# Patient Record
Sex: Female | Born: 1990 | Race: White | Hispanic: No | Marital: Married | Smoking: Never smoker
Health system: Southern US, Community
[De-identification: ages and names within clinical notes are randomized; demographics above are authoritative.]

## PROBLEM LIST (undated history)

## (undated) DIAGNOSIS — I7300 Raynaud's syndrome without gangrene: Secondary | ICD-10-CM

## (undated) DIAGNOSIS — F988 Other specified behavioral and emotional disorders with onset usually occurring in childhood and adolescence: Secondary | ICD-10-CM

## (undated) DIAGNOSIS — J329 Chronic sinusitis, unspecified: Secondary | ICD-10-CM

## (undated) DIAGNOSIS — K589 Irritable bowel syndrome without diarrhea: Secondary | ICD-10-CM

## (undated) DIAGNOSIS — J302 Other seasonal allergic rhinitis: Secondary | ICD-10-CM

## (undated) DIAGNOSIS — K219 Gastro-esophageal reflux disease without esophagitis: Secondary | ICD-10-CM

## (undated) DIAGNOSIS — R350 Frequency of micturition: Secondary | ICD-10-CM

## (undated) DIAGNOSIS — I10 Essential (primary) hypertension: Secondary | ICD-10-CM

## (undated) DIAGNOSIS — T8859XA Other complications of anesthesia, initial encounter: Secondary | ICD-10-CM

## (undated) DIAGNOSIS — T783XXA Angioneurotic edema, initial encounter: Secondary | ICD-10-CM

## (undated) DIAGNOSIS — E611 Iron deficiency: Secondary | ICD-10-CM

## (undated) DIAGNOSIS — O139 Gestational [pregnancy-induced] hypertension without significant proteinuria, unspecified trimester: Secondary | ICD-10-CM

## (undated) DIAGNOSIS — Z87440 Personal history of urinary (tract) infections: Secondary | ICD-10-CM

## (undated) HISTORY — DX: Other complications of anesthesia, initial encounter: T88.59XA

## (undated) HISTORY — DX: Angioneurotic edema, initial encounter: T78.3XXA

## (undated) HISTORY — PX: DRUG INDUCED ENDOSCOPY: SHX6808

## (undated) HISTORY — DX: Iron deficiency: E61.1

## (undated) HISTORY — DX: Other specified behavioral and emotional disorders with onset usually occurring in childhood and adolescence: F98.8

## (undated) HISTORY — DX: Raynaud's syndrome without gangrene: I73.00

## (undated) HISTORY — PX: WISDOM TOOTH EXTRACTION: SHX21

## (undated) HISTORY — DX: Chronic sinusitis, unspecified: J32.9

## (undated) HISTORY — DX: Gestational (pregnancy-induced) hypertension without significant proteinuria, unspecified trimester: O13.9

## (undated) HISTORY — PX: CYSTOSCOPY: SUR368

## (undated) HISTORY — DX: Gastro-esophageal reflux disease without esophagitis: K21.9

## (undated) HISTORY — DX: Personal history of urinary (tract) infections: Z87.440

## (undated) HISTORY — DX: Irritable bowel syndrome, unspecified: K58.9

## (undated) HISTORY — DX: Essential (primary) hypertension: I10

## (undated) HISTORY — DX: Other seasonal allergic rhinitis: J30.2

## (undated) HISTORY — PX: COLONOSCOPY: SHX174

## (undated) HISTORY — DX: Frequency of micturition: R35.0

---
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
## 2011-01-14 ENCOUNTER — Encounter: Payer: Self-pay | Admitting: Gastroenterology

## 2011-02-04 ENCOUNTER — Ambulatory Visit: Payer: 59 | Admitting: Gastroenterology

## 2011-05-15 DIAGNOSIS — R350 Frequency of micturition: Secondary | ICD-10-CM

## 2011-05-15 HISTORY — DX: Frequency of micturition: R35.0

## 2011-12-17 ENCOUNTER — Encounter: Payer: Self-pay | Admitting: Internal Medicine

## 2011-12-17 ENCOUNTER — Ambulatory Visit (INDEPENDENT_AMBULATORY_CARE_PROVIDER_SITE_OTHER): Payer: 59 | Admitting: Internal Medicine

## 2011-12-17 VITALS — BP 124/89 | HR 88 | Temp 98.4°F | Ht 67.0 in | Wt 159.2 lb

## 2011-12-17 DIAGNOSIS — F988 Other specified behavioral and emotional disorders with onset usually occurring in childhood and adolescence: Secondary | ICD-10-CM | POA: Insufficient documentation

## 2011-12-17 DIAGNOSIS — R350 Frequency of micturition: Secondary | ICD-10-CM

## 2011-12-17 DIAGNOSIS — J069 Acute upper respiratory infection, unspecified: Secondary | ICD-10-CM | POA: Insufficient documentation

## 2011-12-17 DIAGNOSIS — F514 Sleep terrors [night terrors]: Secondary | ICD-10-CM | POA: Insufficient documentation

## 2011-12-17 DIAGNOSIS — J302 Other seasonal allergic rhinitis: Secondary | ICD-10-CM | POA: Insufficient documentation

## 2011-12-17 DIAGNOSIS — G478 Other sleep disorders: Secondary | ICD-10-CM

## 2011-12-17 NOTE — Progress Notes (Signed)
Patient ID: Kimberly Larson, female   DOB: 1990-11-07, 21 y.o.   MRN: [id number]    [facility] for Infectious Disease  Reason for Consult: Recurrent upper respiratory infections and possible persistent urinary tract infections Referring Physician: Dr. Silvestre Moment  Patient Active Problem List  Diagnosis  . ADD (attention deficit disorder)  . Seasonal allergies  . Recurrent URI (upper respiratory infection)  . Urinary frequency  . Night terrors    Patient's Medications  New Prescriptions   No medications on file  Previous Medications   AMPHETAMINE-DEXTROAMPHETAMINE (ADDERALL) 30 MG TABLET    Take 30 mg by mouth daily.   CEFDINIR (OMNICEF) 300 MG CAPSULE    Take 300 mg by mouth 2 (two) times daily.   IBUPROFEN (ADVIL,MOTRIN) 200 MG TABLET    Take 600 mg by mouth as needed.   NORGESTIMATE-ETHINYL ESTRADIOL TRIPHASIC (TRI-SPRINTEC) 0.18/0.215/0.25 MG-35 MCG TABLET    Take 1 tablet by mouth daily.   TRAMADOL (ULTRAM) 50 MG TABLET    Take 50 mg by mouth every 6 (six) hours as needed.  Modified Medications   No medications on file  Discontinued Medications   CEFDINIR (OMNICEF) 300 MG CAPSULE    Take 300 mg by mouth every 12 (twelve) hours.    Recommendations: 1. Discontinue cefdinir and observe off of antibiotics  2. I asked her to call me if she has further questions or concerns about recurrent infection  Assessment: I doubt that her persistent urinary frequency is related to urinary tract infections. She has had some evidence of pyuria on occasion but no positive urine cultures that I am aware of. She does not have dysuria or other symptoms of infection and does not respond well to antibiotic therapy. I do not favor episodic or chronic suppressive antibiotic therapy for her urinary frequency. I do not know the cause of her urinary frequency but I do not think that there is any significant benefit to be gained from further antibiotic therapy and would not want to risk increasing  risk of adverse reactions such as C. difficile colitis or emerging multidrug resistant bacteria.  She probably has had recurrent upper respiratory tract infections over the last several months. 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 Group C strep can cause symptomatic pharyngitis but it does not need to be managed like group A strep. I suspect that most of her recent upper respiratory tract infections have been viral in origin. In order to try to limit unnecessary antibiotic exposure I suggested that she try symptomatic therapy with nonsteroidal anti-inflammatory drugs and decongestant nasal sprays for future episodes. I would try to reserve antibiotic therapy for proven episodes of group A strep pharyngitis or if she had persistent symptoms with fever greater than 101 or evidence of pneumonia. I do not think that she has any significant immunodeficiency.   I do not think that there is any direct correlation between her nightmares and her other recent problems. She does not see any direct correlation between starting Adderall in early 2012 and worsening of her sleep pattern. However, I suggested that she discuss this with Dr. Lenise Arena. She reiterates that her exam that she feels that she's under a great deal of stress but denies feeling depressed. She denies any problems with alcohol or drug use. Her father states that he thinks that she might benefit from lifestyle counseling. However, she states that she feels this would only  give her something else to fit into an already packed schedule and that it might only increase her stress. She is attempting to get regular exercise.  HPI: Kimberly Larson is a 21 y.o. 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 She was well until June of last year when she had an episode of severe right lower quadrant abdominal pain. She was seen in the emergency department at [facility]. She had no dysuria at the time but today urinalyses showed 11-20 white blood cells and she was then treated empirically for possible urinary tract infection. She states the pain resolved fairly promptly but several months later she started noticing urinary frequency. She was having to go to the bathroom to urinate about 10 times daily. She had nocturia x1. The urinary frequency has been persistent ever since that time and has never completely resolved. She recalls having many urine cultures done and believes most of them have been negative. She had been treated with multiple rounds of antibiotics. She states that she may notice a little bit of improvement with the frequency but the antibiotics did not make it go away. She was on a long course of suppressive amoxicillin without any improvement. Her gynecologist, Dr. Duane Lope, referred her to see Dr. Bjorn Pippin, for urological evaluation. He performed a CT scan which showed some bilateral scarring suggesting possible ureteral reflux. She later saw Dr. Eudelia Bunch at [facility] and underwent cystoscopy urethrogram and cystoscopy 5 days ago. She told that the studies were completely normal he was put on Macrobid for chronic suppressive therapy.  She has also had recent problems with recurrent sinus congestion. She estimates that this has happened 4-5 times since January. She will notice sinus congestion with purulent nasal secretions, sore throat and pressure over her sinuses. She will occasionally have low-grade fever to a maximum of 100.1. She has been seen by several different providers hearing [place] and then balloon. She has had multiple throat swabs for strep. One culture grew group C strep. She has had multiple courses of antibiotics for presumed upper respiratory infections. She was seen recently her primary care office. A rapid strep screen was negative but she was put on Cefdinir for the possibility that the  group C strep was causing symptomatic infection. Macrobid was stopped at that time.  She also notes that she has had problems with nightmares and even night terrors for several years. She saw psychologist several years ago. She feels like the nightmares have worsened recently and wanted to know if that might have some relationship with her recurrent infections. When she is in school she often does not go to bed until midnight and may not fall asleep until 1:00. She will usually be up out of bed by 7:00 for class. She is getting more sleep during summer break but does not feel like she is sleeping well and does not feel rested during the day.  Review of Systems: Pertinent items are noted in HPI.      Past Medical History  Diagnosis Date  . ADD (attention deficit disorder)   . Seasonal allergies   . Chronic sinus infection   . Urinary frequency 05/15/11  . Personal history of urinary (tract) infection     History  Substance Use Topics  . Smoking status: Never Smoker   . Smokeless tobacco: Not on file  . Alcohol Use: No    No family history on file. Allergies  Allergen Reactions  .  Ciprofloxacin Hcl Nausea Only    OBJECTIVE: Blood pressure 124/89, pulse 88, temperature 98.4 F (36.9 C), temperature source Oral, height 5\' 7"  (1.702 m), weight 159 lb 4 oz (72.235 kg). General: She is a healthy-appearing young woman in no distress Skin: No rash Lymph nodes: No palpable adenopathy Oral: Teeth in good condition. No oropharyngeal lesions Ears: Tympanic membranes normal bilaterally Lungs: Clear Cor: Regular S1 and S2 with no murmurs Abdomen: Soft and nontender  Microbiology: No results found for this or any previous visit (from the past 240 hour(s)).  Cliffton Asters, MD [facility] for Infectious Disease [facility] [phone] pager   [phone] cell 12/17/2011, 5:01 PM

## 2014-10-02 ENCOUNTER — Ambulatory Visit (INDEPENDENT_AMBULATORY_CARE_PROVIDER_SITE_OTHER): Payer: 59 | Admitting: Licensed Clinical Social Worker

## 2014-10-02 DIAGNOSIS — F331 Major depressive disorder, recurrent, moderate: Secondary | ICD-10-CM

## 2014-10-23 ENCOUNTER — Ambulatory Visit: Payer: 59 | Admitting: Licensed Clinical Social Worker

## 2015-10-02 ENCOUNTER — Ambulatory Visit (INDEPENDENT_AMBULATORY_CARE_PROVIDER_SITE_OTHER): Payer: Managed Care, Other (non HMO) | Admitting: Psychiatry

## 2015-10-02 DIAGNOSIS — F901 Attention-deficit hyperactivity disorder, predominantly hyperactive type: Secondary | ICD-10-CM

## 2017-06-29 ENCOUNTER — Encounter: Payer: Self-pay | Admitting: Gastroenterology

## 2017-07-23 DIAGNOSIS — F411 Generalized anxiety disorder: Secondary | ICD-10-CM | POA: Diagnosis not present

## 2017-07-28 DIAGNOSIS — D122 Benign neoplasm of ascending colon: Secondary | ICD-10-CM | POA: Diagnosis not present

## 2017-07-28 DIAGNOSIS — D125 Benign neoplasm of sigmoid colon: Secondary | ICD-10-CM | POA: Diagnosis not present

## 2017-07-28 DIAGNOSIS — K6389 Other specified diseases of intestine: Secondary | ICD-10-CM | POA: Diagnosis not present

## 2017-07-28 DIAGNOSIS — K921 Melena: Secondary | ICD-10-CM | POA: Diagnosis not present

## 2017-07-28 DIAGNOSIS — R1084 Generalized abdominal pain: Secondary | ICD-10-CM | POA: Diagnosis not present

## 2017-07-28 DIAGNOSIS — R194 Change in bowel habit: Secondary | ICD-10-CM | POA: Diagnosis not present

## 2017-07-28 DIAGNOSIS — K635 Polyp of colon: Secondary | ICD-10-CM | POA: Diagnosis not present

## 2017-07-29 ENCOUNTER — Encounter: Payer: Self-pay | Admitting: Gastroenterology

## 2017-07-29 DIAGNOSIS — R197 Diarrhea, unspecified: Secondary | ICD-10-CM | POA: Diagnosis not present

## 2017-07-29 DIAGNOSIS — D125 Benign neoplasm of sigmoid colon: Secondary | ICD-10-CM | POA: Diagnosis not present

## 2017-08-06 DIAGNOSIS — F411 Generalized anxiety disorder: Secondary | ICD-10-CM | POA: Diagnosis not present

## 2017-08-20 DIAGNOSIS — Z01419 Encounter for gynecological examination (general) (routine) without abnormal findings: Secondary | ICD-10-CM | POA: Diagnosis not present

## 2017-08-20 DIAGNOSIS — Z124 Encounter for screening for malignant neoplasm of cervix: Secondary | ICD-10-CM | POA: Diagnosis not present

## 2017-08-27 DIAGNOSIS — F411 Generalized anxiety disorder: Secondary | ICD-10-CM | POA: Diagnosis not present

## 2017-08-31 DIAGNOSIS — R350 Frequency of micturition: Secondary | ICD-10-CM | POA: Diagnosis not present

## 2017-08-31 DIAGNOSIS — R030 Elevated blood-pressure reading, without diagnosis of hypertension: Secondary | ICD-10-CM | POA: Diagnosis not present

## 2017-08-31 DIAGNOSIS — R1084 Generalized abdominal pain: Secondary | ICD-10-CM | POA: Diagnosis not present

## 2018-12-17 ENCOUNTER — Telehealth: Payer: Self-pay | Admitting: *Deleted

## 2018-12-17 NOTE — Telephone Encounter (Signed)
Received an email from [employer] that had been passed down the line to her from the patient's father that she was in need of a Cardiology consult.  I called and spoke with the patient who states that her apple watch and notified her of a low HR of 36 while she was sleeping.  She has been on Bisoprolol for HTN and is currently weaning off.  Last dose is scheduled for Sat.  Patient states that she works out 3-4 times a week and is down 30 pounds from her heaviest weight.  Last week after her workout she had chest pain that she describes as "feeling like her begal dog was sitting on her chest."  This resolved on it's on and has not occurred since.  She did not work out the next day but has since resumed and had no problems.  She is followed by Dr. Donnetta Hail [facility]  as her PCP who has advised a monitor.  On 12/16/18 she was seen by her GYN and her BP was 149/90.  She also has anemia and IBS and is needing an upper endoscopy but her GI doctor in [place] will not do this until she has been worked up by cardiology.  I have scheduled her with Dr. Saunders Revel for 12/22/18 at 8:40am.  She is aware to wear a mask and not arrive more than 15 mins prior to the appointment.  I gave her the address of the office and number.  She was appreciative of my help.

## 2018-12-20 NOTE — Progress Notes (Signed)
New Outpatient Visit Date: 12/22/2018  Referring Provider: Danie Larson, Kimberly M, PA-C [address] [place] [id number]  Chief Complaint: Low heart rate, chest pain, and headaches  HPI:  Kimberly Larson is a 28 y.o. female who is being seen today for the evaluation of chest pain and low heart rate at the request of Kimberly Larson, Kimberly M, PA-C. She has a history of ADD, recurrent URI's and strep infections, IBS, and seasonal allergies.  Today, Kimberly Larson has multiple concerns.  She began having headaches last year, which prompted her to take her blood pressure.  She noticed this was typically elevated, up to 150/105.  She was started on bisoprolol-HCTZ by her PCP.  A few weeks ago, she began experiencing recurrent headaches, which have continued.  She describes her headaches as feeling as if her head were being "juiced.".  She takes acetaminophen and ibuprofen with some improvement.  Around the time this began last month, Kimberly Larson's Apple Watch began notifying her of low heart rates overnight, with readings as low as 36 bpm.  She reports some bradycardia in the past, though never this low.  There were not any medication changes around that time.  She was seen by her PCP on 12/03/2018, at which time it was agreed upon to discontinue bisoprolol-HCTZ, oral contraceptive therapy, and Strattera.  She continues to use Adderall, though not on a daily basis.  Last month, Kimberly Larson also had an episode of chest pain that occurred while she was running.  She describes it as a stabbing sensation in the center of her chest that was followed by nausea and vomiting.  She looked in the mirror and noticed blue discoloration along the base of her neck and extending down to her chest.  Sharp pain gradually resolved with rest, though she continued to have heaviness across her chest into the next day.  That has since resolved on its own.  She still feels like her sternum is rotated at times, with increased discomfort when  lying on her left side in bed.  She is currently undergoing GI work-up with plans for EGD once more recent cardiac issues have been evaluated.  Kimberly Larson denies a history of cardiac disease.  She has not experienced palpitations, lightheadedness, orthopnea, PND, or edema.  She is typically active and exercises regularly, not having any symptoms until the aforementioned episode of chest pain.  She has not been exercising regularly since then.  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  She was recently started on iron supplementation after being found to be iron deficient.  However, she has not felt any different since starting this.  --------------------------------------------------------------------------------------------------  Cardiovascular History & Procedures: Cardiovascular Problems:  Atypical chest pain  Hypertension  Risk Factors:  Hypertension  Cath/PCI:  None  CV Surgery:  None  EP Procedures and Devices:  None  Non-Invasive Evaluation(s):  None  --------------------------------------------------------------------------------------------------  Past Medical History:  Diagnosis Date  . ADD (attention deficit disorder)   . Chronic sinus infection   . Iron deficiency   . Irritable bowel syndrome   . Personal history of urinary (tract) infection   . Raynaud's disease   . Seasonal allergies   . Urinary frequency 05/15/11    History reviewed. No pertinent surgical history.  Current Meds  Medication Sig  . amphetamine-dextroamphetamine (ADDERALL) 10 MG tablet Take 10 mg by mouth daily with breakfast.  . ferrous sulfate 324 MG TBEC Take 324 mg by mouth daily.  .Marland Kitchen  valACYclovir (VALTREX) 500 MG tablet Take by mouth as needed.    Allergies: Ciprofloxacin hcl  Social History   Tobacco Use  . Smoking status: Never Smoker  . Smokeless tobacco: Never Used  Substance Use Topics  . Alcohol use: Not on file  . Drug use: No    Family History  Problem  Relation Age of Onset  . Hypertension Mother   . Graves' disease Mother   . Other Father        trigeminal neuralgia  . Hypertension Brother     Review of Systems: A 12-system review of systems was performed and was negative except as noted in the HPI.  --------------------------------------------------------------------------------------------------  Physical Exam: BP (!) 120/100 (BP Location: Right Arm, Patient Position: Sitting, Cuff Size: Normal)   Pulse (!) 48   Ht 5\' 7"  (1.702 Larson)   Wt 157 lb 4 oz (71.3 kg)   BMI 24.63 kg/Larson   Repeat BP: BP 127/90  General: NAD. HEENT: No conjunctival pallor or scleral icterus.  Facemask in place Neck: Supple without lymphadenopathy, thyromegaly, JVD, or HJR. No carotid bruit. Lungs: Normal work of breathing. Clear to auscultation bilaterally without wheezes or crackles. Heart: Bradycardic but regular without murmurs, rubs, or gallops. Non-displaced PMI. Abd: Bowel sounds present. Soft, NT/ND without hepatosplenomegaly Ext: No lower extremity edema. Radial, PT, and DP pulses are 2+ bilaterally Skin: Warm and dry without rash. Neuro: CNIII-XII intact. Strength and fine-touch sensation intact in upper and lower extremities bilaterally. Psych: Normal mood and affect.  EKG: Bradycardia (heart rate 48 bpm).  Otherwise, no abnormality.  Outside labs (12/03/2018): CMP: Sodium 137, potassium 4.2, chloride 101, CO2 27, BUN 15, creatinine 1.0, glucose 80, calcium 9.6, AST 15, ALT 5, alkaline phosphatase 34, total bilirubin 0.4, total protein 6.7, albumin 4.5  TSH: 1.57  Ferritin: 21.5, iron saturation 12%  ANA: Negative  --------------------------------------------------------------------------------------------------  ASSESSMENT AND PLAN: Sinus bradycardia: Heart rate remains in the upper 40s at rest, albeit without symptoms.  Given young age and regular exercise, this is not excessively slow.  Recent bradycardia noted at night may have been  exacerbated by ongoing beta-blocker use (this has now been discontinued).  Recent thyroid studies were unrevealing.  Given other symptoms including chest pain and hypertension, we have agreed to obtain a 14-day event monitor for further assessment.  If significant nocturnal bradycardia persists, assessment for sleep apnea may need to be considered in the future.  Chest pain: Symptoms have both typical and atypical qualities.  While it did begin with exercise, it lasted for more than a day and was accompanied by bluish discoloration of the chest wall.  EKG today does not show any ischemic changes.  Patient is also quite young for ischemic heart disease.  Given early onset hypertension, secondary causes including coarctation of the aorta, must be considered.  We will begin with a transthoracic echocardiogram.  Based on result, cross-sectional imaging (CTA versus MRA) may need to be considered down the road.  I have asked [name] to refrain from strenuous exercise pending further evaluation.  Hypertension: Blood pressure today is borderline elevated on recheck, notably with high diastolic pressure.  It is unusual for a woman of the patient's age to present with essential hypertension, though she notes a strong family history.  I think it would be reasonable to begin with an echocardiogram to exclude structural cardiac abnormalities and screen for findings suggestive of coarctation, as well as a renal artery Doppler to exclude renal artery stenosis, as the patient's age  and gender place her at increased risk for fibromuscular dysplasia.  If this work-up is unrevealing, further testing for hyperaldosteronism and pheochromocytoma will need to be pursued.  I will defer restarting antihypertensive therapy at this time; I would recommend avoiding beta-blockers in the future given low resting heart rate.  I think it is reasonable to discontinue Strattera as well as consider discontinuation of Adderall, if possible,  as the stimulants can worsen hypertension.  Follow-up: Return to clinic in 6 weeks.  Yvonne Kendallhristopher Marely Apgar, MD 12/22/2018 12:54 PM

## 2018-12-22 ENCOUNTER — Other Ambulatory Visit: Payer: Self-pay

## 2018-12-22 ENCOUNTER — Ambulatory Visit (INDEPENDENT_AMBULATORY_CARE_PROVIDER_SITE_OTHER): Payer: BC Managed Care – PPO

## 2018-12-22 ENCOUNTER — Ambulatory Visit (INDEPENDENT_AMBULATORY_CARE_PROVIDER_SITE_OTHER): Payer: BC Managed Care – PPO | Admitting: Internal Medicine

## 2018-12-22 ENCOUNTER — Encounter: Payer: Self-pay | Admitting: Internal Medicine

## 2018-12-22 VITALS — BP 120/100 | HR 48 | Ht 67.0 in | Wt 157.2 lb

## 2018-12-22 DIAGNOSIS — R001 Bradycardia, unspecified: Secondary | ICD-10-CM | POA: Diagnosis not present

## 2018-12-22 DIAGNOSIS — I10 Essential (primary) hypertension: Secondary | ICD-10-CM

## 2018-12-22 DIAGNOSIS — R0789 Other chest pain: Secondary | ICD-10-CM

## 2018-12-22 DIAGNOSIS — R079 Chest pain, unspecified: Secondary | ICD-10-CM | POA: Insufficient documentation

## 2018-12-22 NOTE — Patient Instructions (Signed)
Medication Instructions:  Your physician recommends that you continue on your current medications as directed. Please refer to the Current Medication list given to you today.  If you need a refill on your cardiac medications before your next appointment, please call your pharmacy.   Lab work: - None ordered.  If you have labs (blood work) drawn today and your tests are completely normal, you will receive your results only by: Marland Kitchen MyChart Message (if you have MyChart) OR . A paper copy in the mail If you have any lab test that is abnormal or we need to change your treatment, we will call you to review the results.  Testing/Procedures: Your physician has recommended that you wear an 14 DAY ZIO event monitor. Event monitors are medical devices that record the heart's electrical activity. Doctors most often [place] these monitors to diagnose arrhythmias. Arrhythmias are problems with the speed or rhythm of the heartbeat. The monitor is a small, portable device. You can wear one while you do your normal daily activities. This is usually used to diagnose what is causing palpitations/syncope (passing out). A Zio Patch Event Heart monitor will be applied to your chest today.  You will wear the patch for 14 days. After 24 hours, you may shower with the heart monitor on. If you feel any SYMPTOMS, you may press and release the button in the middle of the monitor.   Your physician has requested that you have an echocardiogram. Echocardiography is a painless test that uses sound waves to create images of your heart. It provides your doctor with information about the size and shape of your heart and how well your heart's chambers and valves are working. This procedure takes approximately one hour. There are no restrictions for this procedure. You may get an IV, if needed, to receive an ultrasound enhancing agent through to better visualize your heart.    Your physician has requested that you have a renal artery  duplex. During this test, an ultrasound is used to evaluate blood flow to the kidneys. Allow one hour for this exam. Do not eat after midnight the day before and avoid carbonated beverages. Take your medications as you usually do. AORTA/IVC/ILIACS  No food after 11PM the night before.  Water is OK. (Don't drink liquids if you have been instructed not to for ANOTHER test).  Take two Extra-Strength Gas-X capsules at bedtime the night before test.   Take an additional two Extra-Strength Gas-X capsules three (3) hours before the test or first thing in the morning.    Avoid foods that produce bowel gas, for 24 hours prior to exam (see below).    No breakfast, no chewing gum, no smoking or carbonated beverages.  Patient may take morning medications with water.  Come in for test at least 15 minutes early to register.    Follow-Up: At [facility], you and your health needs are our priority.  As part of our continuing mission to provide you with exceptional heart care, we have created designated Provider Care Teams.  These Care Teams include your primary Cardiologist (physician) and Advanced Practice Providers (APPs -  Physician Assistants and Nurse Practitioners) who all work together to provide you with the care you need, when you need it. You will need a follow up appointment in 6 weeks.  Please call our office 2 months in advance to schedule this appointment.  You may see DR Harrell Gave END or one of the following Advanced Practice Providers on your designated Care Team:  Nicolasa Duckinghristopher Berge, NP Eula Listenyan Dunn, PA-C . Marisue IvanJacquelyn Visser, PA-C    Echocardiogram An echocardiogram is a procedure that uses painless sound waves (ultrasound) to produce an image of the heart. Images from an echocardiogram can provide important information about:  Signs of coronary artery disease (CAD).  Aneurysm detection. An aneurysm is a weak or damaged part of an artery wall that bulges out from the normal force  of blood pumping through the body.  Heart size and shape. Changes in the size or shape of the heart can be associated with certain conditions, including heart failure, aneurysm, and CAD.  Heart muscle function.  Heart valve function.  Signs of a past heart attack.  Fluid buildup around the heart.  Thickening of the heart muscle.  A tumor or infectious growth around the heart valves. Tell a health care provider about:  Any allergies you have.  All medicines you are taking, including vitamins, herbs, eye drops, creams, and over-the-counter medicines.  Any blood disorders you have.  Any surgeries you have had.  Any medical conditions you have.  Whether you are pregnant or may be pregnant. What are the risks? Generally, this is a safe procedure. However, problems may occur, including:  Allergic reaction to dye (contrast) that may be used during the procedure. What happens before the procedure? No specific preparation is needed. You may eat and drink normally. What happens during the procedure?   An IV tube may be inserted into one of your veins.  You may receive contrast through this tube. A contrast is an injection that improves the quality of the pictures from your heart.  A gel will be applied to your chest.  A wand-like tool (transducer) will be moved over your chest. The gel will help to transmit the sound waves from the transducer.  The sound waves will harmlessly bounce off of your heart to allow the heart images to be captured in real-time motion. The images will be recorded on a computer. The procedure may vary among health care providers and hospitals. What happens after the procedure?  You may return to your normal, everyday life, including diet, activities, and medicines, unless your health care provider tells you not to do that. Summary  An echocardiogram is a procedure that uses painless sound waves (ultrasound) to produce an image of the heart.  Images  from an echocardiogram can provide important information about the size and shape of your heart, heart muscle function, heart valve function, and fluid buildup around your heart.  You do not need to do anything to prepare before this procedure. You may eat and drink normally.  After the echocardiogram is completed, you may return to your normal, everyday life, unless your health care provider tells you not to do that. This information is not intended to replace advice given to you by your health care provider. Make sure you discuss any questions you have with your health care provider. Document Released: 04/25/2000 Document Revised: 08/19/2018 Document Reviewed: 05/31/2016 Elsevier Patient Education  2020 [employer]

## 2018-12-31 ENCOUNTER — Telehealth: Payer: Self-pay | Admitting: Internal Medicine

## 2018-12-31 NOTE — Telephone Encounter (Signed)
Pt c/o BP issue: STAT if pt c/o blurred vision, one-sided weakness or slurred speech  1. What are your last 5 BP readings? Today 142/92 - highest she remembers was 150/105   2. Are you having any other symptoms (ex. Dizziness, headache, blurred vision, passed out)? Horrible headaches   3. What is your BP issue? Has been high since coming off medication.  Would like to know if she can go ahead and start a new BP medication or does Dr End want to wait til after her tests.  Please call to discuss.  Patient also has a question in regards to heart monitor, wants to know if on her last day before sending heart monitor in does she wear it the full day then send it in or send it in that day.

## 2018-12-31 NOTE — Telephone Encounter (Signed)
Spoke with patient. Reports blood pressure remaining elevated and constant headaches. Same headaches as she had prior to bisoprolol. Stopped Bisoprolol around 12/09/18. Feels like "juicing" her head. Just weaned off the Strattera and is no longer taking it. Take ibuprofen 600 mg two times a day and sometime tylenol.  Headache only improves for 1-2 hours.  Wearing ZIO currently and will remove on 01/05/19. BP is staying around 140's/90. HR averaging 50-70's. As low as 40's when sleeping and high as 149 on days she take Adderall.  Headache is constant and more of an inconvenience. States she can deal with it until after her echo and renal doppler scheduled for 01/12/19 if necessary. However, would like Dr Darnelle Bos recommendation if ok to start any other BP medication at this time to help with BP and headaches.  Advised I will route to Dr End for advice and review.

## 2018-12-31 NOTE — Telephone Encounter (Signed)
I do not believe that her modest blood pressure elevation is causing her headache.  If anything, elevated blood pressure may be a physiologic response to pain.  If HA continues, I recommend that she speak with her PCP about further evaluation.  We will move forward to echo and ziopatch, as planned.  [name], MD [facility] Pager: [phone]

## 2018-12-31 NOTE — Telephone Encounter (Signed)
Called and spoke with patient. She verbalized understanding of Dr Darnelle Bos recommendations. She will reach out to PCP if needed.

## 2019-01-12 ENCOUNTER — Other Ambulatory Visit: Payer: Self-pay

## 2019-01-12 ENCOUNTER — Ambulatory Visit (INDEPENDENT_AMBULATORY_CARE_PROVIDER_SITE_OTHER): Payer: BC Managed Care – PPO

## 2019-01-12 DIAGNOSIS — R001 Bradycardia, unspecified: Secondary | ICD-10-CM

## 2019-01-12 DIAGNOSIS — R079 Chest pain, unspecified: Secondary | ICD-10-CM

## 2019-01-12 DIAGNOSIS — I10 Essential (primary) hypertension: Secondary | ICD-10-CM

## 2019-01-12 DIAGNOSIS — R0789 Other chest pain: Secondary | ICD-10-CM | POA: Diagnosis not present

## 2019-01-13 ENCOUNTER — Telehealth: Payer: Self-pay

## 2019-01-13 NOTE — Telephone Encounter (Signed)
Call to patient to discuss results on renal artery u/s. Pt verbalized understanding and had no further questions at this time. No new orders. F/u as planned.

## 2019-01-13 NOTE — Telephone Encounter (Signed)
-----   Message from [name], MD sent at 01/13/2019  9:20 AM EDT ----- Please let [name] know that her renal artery Doppler is normal without evidence of renal artery stenosis.  We will f/u with her after interpretation of the cardiac event monitor.

## 2019-01-18 ENCOUNTER — Telehealth: Payer: Self-pay | Admitting: Internal Medicine

## 2019-01-18 ENCOUNTER — Telehealth: Payer: Self-pay

## 2019-01-18 NOTE — Telephone Encounter (Signed)
Patient calling to discuss recent testing results  ° °Please call  ° °

## 2019-01-18 NOTE — Telephone Encounter (Signed)
Patient made aware of echo results with verbal understanding. 

## 2019-01-18 NOTE — Telephone Encounter (Signed)
I spoke with Kimberly Larson regarding the results of her recent testing, including echocardiogram and cardiac event monitor.  Monitor was notable for rare PACs and PVCs as well as episodes of ectopic atrial rhythm and brief accelerated idioventricular rhythm.  Symptoms corresponded predominantly to sinus rhythm.  Overall, Kimberly Larson reports that she is feeling better since weaning off some of her medications.  We have agreed to defer additional testing for now and to follow-up later this month in the office to reassess her symptoms and blood pressure.  [name], MD [facility] Pager: [phone]

## 2019-01-18 NOTE — Telephone Encounter (Signed)
Called to give the patient echo results. [email or web address]. 

## 2019-01-18 NOTE — Telephone Encounter (Signed)
-----   Message from [name], MD sent at 01/14/2019  2:26 PM EDT ----- Please let the patient know that her echocardiogram is normal.

## 2019-01-31 NOTE — Progress Notes (Signed)
 Follow-up Outpatient Visit Date: 02/02/2019  Primary Care Provider: Nelson, Kristen M, PA-C [address] [place] [address]  Chief Complaint: Headaches  HPI:  Kimberly Larson is a 28 y.o. year-old female with history of hypertension, ADD, recurrent URIs and strep infections, irritable bowel syndrome, and seasonal allergies, who presents for follow-up of bradycardia, chest pain, and headaches.  I met Kimberly Larson in mid August, at which time she reported intermittent headaches accompanied by low heart rates registered by her Apple Watch.  This began in the setting of having been started on bisoprolol-HCTZ by her PCP due to elevated blood pressure readings.  An episode of chest pain that occurred while running.  She described it as a stabbing sensation in the center of her chest followed by nausea and vomiting.  We agreed to a 14-day event monitor and obtain an echocardiogram (detailed below).  Renal artery Doppler was also ordered due to early onset of high blood pressure.  Monitor showed episodes of ectopic atrial rhythm and accelerated idioventricular rhythm.  Today, Kimberly Larson overall feels better with improved blood pressure and less frequent chest pain.  The chest pain is not exertional and is most noticeable when she is lying down (though it can happen with her daily activities).  There are no associated symptoms.  She feels like iron supplementation has helped improve her symptoms, particularly her lightheadedness.  She continues to use Adderall most weekdays.  Home BP is occasionally up to 130-140/90, though this is typically in the setting of a headache.  She has not experienced blue discoloration of her chest since our last visit.  Heart rates have dipped into the upper 30's a few times overnight but frequency has decreased with discontinuation of bisoprolol-HCTZ.  Kimberly Larson reports daily headaches that feel as if her head is "being juiced."  At times, she sees bright lights when having  the headaches.  There are no associated palpitations.  She does not have a history of migraine headaches.  She is currently using acetaminophen and ibuprofen daily to control her headaches.  She also uses CBD gummies at times and feels like these help when taken with acetaminophen or ibuprofen.  --------------------------------------------------------------------------------------------------  Cardiovascular History & Procedures: Cardiovascular Problems:  Atypical chest pain  Hypertension  Ectopic atrial rhythm  Risk Factors:  Hypertension  Cath/PCI:  None  CV Surgery:  None  EP Procedures and Devices:  14-day event monitor (01/11/2019): Predominantly sinus rhythm with rare PACs and PVCs as well as episodes of ectopic atrial rhythm and accelerated idioventricular rhythm.  Non-Invasive Evaluation(s):  Renal artery Doppler (01/12/2019): Normal study without evidence of renal artery stenosis.  TTE (01/12/2019): Normal LV size and wall thickness.  LVEF 55-60% with normal diastolic function.  Normal RV size and function.  No significant valvular abnormality.  Recent CV Pertinent Labs: No results found for: CHOL, HDL, LDLCALC, LDLDIRECT, TRIG, CHOLHDL, INR, BNP, K, MG, BUN, CREATININE  Past medical and surgical history were reviewed and updated in EPIC.  Current Meds  Medication Sig  . amphetamine-dextroamphetamine (ADDERALL) 10 MG tablet Take 10 mg by mouth daily with breakfast.  . ferrous sulfate 324 MG TBEC Take 324 mg by mouth daily.  . ibuprofen (ADVIL) 200 MG tablet Take 600 mg by mouth every 6 (six) hours as needed.  . valACYclovir (VALTREX) 500 MG tablet Take by mouth as needed.    Allergies: Ciprofloxacin hcl  Social History   Tobacco Use  . Smoking status: Never Smoker  .   Smokeless tobacco: Never Used  Substance Use Topics  . Alcohol use: Not on file  . Drug use: No    Family History  Problem Relation Age of Onset  . Hypertension Mother   . Graves'  disease Mother   . Other Father        trigeminal neuralgia  . Hypertension Brother     Review of Systems: A 12-system review of systems was performed and was negative except as noted in the HPI.  --------------------------------------------------------------------------------------------------  Physical Exam: BP 110/78 (BP Location: Left Arm, Patient Position: Sitting, Cuff Size: Normal)   Pulse (!) 47   Ht 5' 7" (1.702 m)   Wt 155 lb 8 oz (70.5 kg)   SpO2 99%   BMI 24.35 kg/m   General:  NAD HEENT: No conjunctival pallor or scleral icterus. Facemask in place. Neck: Supple without lymphadenopathy, thyromegaly, JVD, or HJR. Lungs: Normal work of breathing. Clear to auscultation bilaterally without wheezes or crackles. Heart: Bradycardic but regular without murmurs, rubs, or gallops. Abd: Bowel sounds present. Soft, NT/ND without hepatosplenomegaly Ext: No lower extremity edema. Radial, PT, and DP pulses are 2+ bilaterally. Skin: Warm and dry without rash.  EKG:  Sinus bradycardia.  No significant abnormality.  Outside labs (12/03/2018): CMP: Sodium 137, potassium 4.2, chloride 101, CO2 27, BUN 15, creatinine 1.0, glucose 80, calcium 9.6, AST 15, ALT 5, alkaline phosphatase 34, total bilirubin 0.4, total protein 6.7, albumin 4.5  TSH: 1.57  Ferritin: 21.5, iron saturation 12%  ANA: Negative  --------------------------------------------------------------------------------------------------  ASSESSMENT AND PLAN: Sinus bradycardia, ectopic atrial rhythm, and accelerated idioventricular rhythm: Still present but without significant symptoms.  [name] walked around our office today with appropriate heart rate response (up to 80 bpm).  I recommend avoidance of beta-blockers and non-dihydropyridine calcium channel blockers for now.  Given lack of symptoms, I do not recommend further workup of incidentally noted ectopic atrial rhythm and brief AIVR noted on recent event  monitor.  Chest pain: Atypical and usually not exertional.  Given young age, lack of significant risk factors, and reassuring echocardiogram, we will defer further workup at this time.  Hypertension: BP well controlled today and typically borderline/elevated in the setting of headaches.  I suspect pain may be elevating her BP during these episodes.  I have recommended continued home monitoring.  No medication changes at this time.  Headaches: Daily and associated with seeing flashes of light.  [name] is using APAP and NSAIDS daily, which places her at risk for rebound headaches as well.  I have recommended that she speak with her PCP about a referral to a headache specialist if symptoms persist.  Follow-up: RTC 6 months.  [name], MD 02/02/2019 3:41 PM

## 2019-02-02 ENCOUNTER — Other Ambulatory Visit: Payer: Self-pay

## 2019-02-02 ENCOUNTER — Ambulatory Visit (INDEPENDENT_AMBULATORY_CARE_PROVIDER_SITE_OTHER): Payer: BC Managed Care – PPO | Admitting: Internal Medicine

## 2019-02-02 ENCOUNTER — Encounter: Payer: Self-pay | Admitting: Internal Medicine

## 2019-02-02 VITALS — BP 110/78 | HR 47 | Ht 67.0 in | Wt 155.5 lb

## 2019-02-02 DIAGNOSIS — I10 Essential (primary) hypertension: Secondary | ICD-10-CM

## 2019-02-02 NOTE — Patient Instructions (Signed)
Medication Instructions:  Your physician recommends that you continue on your current medications as directed. Please refer to the Current Medication list given to you today.  If you need a refill on your cardiac medications before your next appointment, please call your pharmacy.   Lab work: NONE If you have labs (blood work) drawn today and your tests are completely normal, you will receive your results only by: . MyChart Message (if you have MyChart) OR . A paper copy in the mail If you have any lab test that is abnormal or we need to change your treatment, we will call you to review the results.  Testing/Procedures: NONE  Follow-Up: At [facility], you and your health needs are our priority.  As part of our continuing mission to provide you with exceptional heart care, we have created designated Provider Care Teams.  These Care Teams include your primary Cardiologist (physician) and Advanced Practice Providers (APPs -  Physician Assistants and Nurse Practitioners) who all work together to provide you with the care you need, when you need it. You will need a follow up appointment in 6 months.   Please call our office 2 months in advance to schedule this appointment.  You may see DR CHRISTOPHER END or one of the following Advanced Practice Providers on your designated Care Team:   Christopher Berge, NP Ryan Dunn, PA-C . Jacquelyn Visser, PA-C    

## 2019-02-03 ENCOUNTER — Encounter: Payer: Self-pay | Admitting: Internal Medicine

## 2019-02-14 ENCOUNTER — Telehealth: Payer: Self-pay | Admitting: Internal Medicine

## 2019-02-14 NOTE — Telephone Encounter (Signed)
   Primary Cardiologist: [name], MD  Chart reviewed as part of pre-operative protocol coverage. Given past medical history and time since last visit, based on ACC/AHA guidelines, Kimberly Larson would be at acceptable risk for the planned procedure without further cardiovascular testing.   Kimberly Larson was last seen on 02/02/2019 for follow-up of bradycardia by Dr. Saunders Revel.  At that time, she had improved blood pressures with less frequent chest pain.  She previously wore a monitor which showed sinus bradycardia, ectopic atrial rhythm and accelerated idioventricular rhythm.  She walked around the office with appropriate heart rate response showing chronotropic competence.  Recommendations were to avoid beta-blockers and non-dihydropyridine therapies.  An echocardiogram was performed for her atypical chest pain which was normal with no further recommendations for cardiac work-up given young age, lack of significant risk factors and reassuring echo.  Her largest complaint at that time was her recurrent headaches.  She was referred back to her PCP for possible referral to headache specialist.  I will route this recommendation to the requesting party via Epic fax function and remove from pre-op pool.  Please call with questions.  Kathyrn Drown, NP 02/14/2019, 2:58 PM

## 2019-02-14 NOTE — Telephone Encounter (Signed)
° °  [facility] Pre-operative Risk Assessment    Request for surgical clearance:  1. What type of surgery is being performed? EGD  2. When is this surgery scheduled? Unknown   3. What type of clearance is required (medical clearance vs. Pharmacy clearance to hold med vs. Both)? Medical   4. Are there any medications that need to be held prior to surgery and how long? Not noted   5. Practice name and name of physician performing surgery? [facility] Dr. Sabra Heck   6. What is your office phone number  [phone]   7.   What is your office fax number  [phone]  8.   Anesthesia type (None, local, MAC, general) ? Not noted    Kimberly Larson 02/14/2019, 2:37 PM  _________________________________________________________________   (provider comments below)

## 2019-03-24 ENCOUNTER — Encounter: Payer: Self-pay | Admitting: Gastroenterology

## 2019-06-16 ENCOUNTER — Other Ambulatory Visit: Payer: Self-pay | Admitting: Physician Assistant

## 2019-06-16 DIAGNOSIS — L659 Nonscarring hair loss, unspecified: Secondary | ICD-10-CM | POA: Diagnosis not present

## 2019-06-16 DIAGNOSIS — N921 Excessive and frequent menstruation with irregular cycle: Secondary | ICD-10-CM

## 2019-06-16 DIAGNOSIS — L700 Acne vulgaris: Secondary | ICD-10-CM | POA: Diagnosis not present

## 2019-06-16 DIAGNOSIS — F909 Attention-deficit hyperactivity disorder, unspecified type: Secondary | ICD-10-CM | POA: Diagnosis not present

## 2019-06-17 ENCOUNTER — Other Ambulatory Visit: Payer: Self-pay

## 2019-06-17 ENCOUNTER — Ambulatory Visit (INDEPENDENT_AMBULATORY_CARE_PROVIDER_SITE_OTHER): Payer: BC Managed Care – PPO

## 2019-06-17 DIAGNOSIS — N921 Excessive and frequent menstruation with irregular cycle: Secondary | ICD-10-CM | POA: Diagnosis not present

## 2019-07-12 DIAGNOSIS — R109 Unspecified abdominal pain: Secondary | ICD-10-CM | POA: Diagnosis not present

## 2019-07-12 DIAGNOSIS — R195 Other fecal abnormalities: Secondary | ICD-10-CM | POA: Diagnosis not present

## 2019-07-12 DIAGNOSIS — R350 Frequency of micturition: Secondary | ICD-10-CM | POA: Diagnosis not present

## 2019-07-12 DIAGNOSIS — K5900 Constipation, unspecified: Secondary | ICD-10-CM | POA: Diagnosis not present

## 2019-07-13 DIAGNOSIS — R195 Other fecal abnormalities: Secondary | ICD-10-CM | POA: Diagnosis not present

## 2019-08-02 ENCOUNTER — Telehealth: Payer: Self-pay | Admitting: Internal Medicine

## 2019-08-02 NOTE — Telephone Encounter (Signed)
  Patient Consent for Virtual Visit         Kimberly Larson has provided verbal consent on 08/02/2019 for a virtual visit (video or telephone).   CONSENT FOR VIRTUAL VISIT FOR:  Kimberly Larson  By participating in this virtual visit I agree to the following:  I hereby voluntarily request, consent and authorize [facility] and its employed or contracted physicians, physician assistants, nurse practitioners or other licensed health care professionals (the Practitioner), to provide me with telemedicine health care services (the "Services") as deemed necessary by the treating Practitioner. I acknowledge and consent to receive the Services by the Practitioner via telemedicine. I understand that the telemedicine visit will involve communicating with the Practitioner through live audiovisual communication technology and the disclosure of certain medical information by electronic transmission. I acknowledge that I have been given the opportunity to request an in-person assessment or other available alternative prior to the telemedicine visit and am voluntarily participating in the telemedicine visit.  I understand that I have the right to withhold or withdraw my consent to the use of telemedicine in the course of my care at any time, without affecting my right to future care or treatment, and that the Practitioner or I may terminate the telemedicine visit at any time. I understand that I have the right to inspect all information obtained and/or recorded in the course of the telemedicine visit and may receive copies of available information for a reasonable fee.  I understand that some of the potential risks of receiving the Services via telemedicine include:  Marland Kitchen Delay or interruption in medical evaluation due to technological equipment failure or disruption; . Information transmitted may not be sufficient (e.g. poor resolution of images) to allow for appropriate medical decision making by the  Practitioner; and/or  . In rare instances, security protocols could fail, causing a breach of personal health information.  Furthermore, I acknowledge that it is my responsibility to provide information about my medical history, conditions and care that is complete and accurate to the best of my ability. I acknowledge that Practitioner's advice, recommendations, and/or decision may be based on factors not within their control, such as incomplete or inaccurate data provided by me or distortions of diagnostic images or specimens that may result from electronic transmissions. I understand that the practice of medicine is not an exact science and that Practitioner makes no warranties or guarantees regarding treatment outcomes. I acknowledge that a copy of this consent can be made available to me via my patient portal [facility] MyChart), or I can request a printed copy by calling the office of [facility].    I understand that my insurance will be billed for this visit.   I have read or had this consent read to me. . I understand the contents of this consent, which adequately explains the benefits and risks of the Services being provided via telemedicine.  . I have been provided ample opportunity to ask questions regarding this consent and the Services and have had my questions answered to my satisfaction. . I give my informed consent for the services to be provided through the use of telemedicine in my medical care

## 2019-09-01 ENCOUNTER — Telehealth (INDEPENDENT_AMBULATORY_CARE_PROVIDER_SITE_OTHER): Payer: BC Managed Care – PPO | Admitting: Internal Medicine

## 2019-09-01 ENCOUNTER — Other Ambulatory Visit: Payer: Self-pay

## 2019-09-01 ENCOUNTER — Encounter: Payer: Self-pay | Admitting: Internal Medicine

## 2019-09-01 VITALS — Ht 67.0 in | Wt 147.0 lb

## 2019-09-01 DIAGNOSIS — R0789 Other chest pain: Secondary | ICD-10-CM

## 2019-09-01 DIAGNOSIS — I7300 Raynaud's syndrome without gangrene: Secondary | ICD-10-CM | POA: Diagnosis not present

## 2019-09-01 DIAGNOSIS — R001 Bradycardia, unspecified: Secondary | ICD-10-CM

## 2019-09-01 DIAGNOSIS — I10 Essential (primary) hypertension: Secondary | ICD-10-CM | POA: Diagnosis not present

## 2019-09-01 MED ORDER — AMLODIPINE BESYLATE 2.5 MG PO TABS: 2.5000 mg | ORAL_TABLET | Freq: Every day | ORAL | 1 refills | Status: DC

## 2019-09-01 NOTE — Progress Notes (Signed)
Virtual Visit via Telephone Note   This visit type was conducted due to national recommendations for restrictions regarding the COVID-19 Pandemic (e.g. social distancing) in an effort to limit this patient's exposure and mitigate transmission in our community.  Due to her co-morbid illnesses, this patient is at least at moderate risk for complications without adequate follow up.  This format is felt to be most appropriate for this patient at this time.  The patient did not have access to video technology/had technical difficulties with video requiring transitioning to audio format only (telephone).  All issues noted in this document were discussed and addressed.  No physical exam could be performed with this format.  Please refer to the patient's chart for her  consent to telehealth for [facility].   The patient was identified using 2 identifiers.  Date:  09/01/2019   ID:  Kimberly Larson, DOB 09-08-1990, MRN [id number]  Patient Location: Home Provider Location: Home  PCP:  Garth Bigness (Inactive)  Cardiologist:  [name], MD  Electrophysiologist:  None   Evaluation Performed:  Follow-Up Visit  Chief Complaint:  Follow-up bradycardia and chest pain  History of Present Illness:    Kimberly Larson is a 29 y.o. female with hypertension, ADD, recurrent URIs and strep infections, irritable bowel syndrome, and seasonal allergies.  We are speaking today for follow-up of bradycardia and chest pain.  I last saw her in late September, at which time [name] reported better blood pressure control and less frequent chest pain.  No medication changes or additional testing were recommended at that time.    Today, [name] reports feeling about he same.  She has brief episodes of chest pain on average every other day.  The pain is usually not exertional and sometimes come on when she lies down in certain positions.  She notes a history of Raynaud's phenomenon and wonders if  it is related to her chest pain.  She denies shortness of breath, palpitations, and lightheadedness other than brief orthostatic lightheadedness lasting a second or two if she stands up too quickly.  [name] continues to receive low heart rate alerts from her heart rate tracker when she is relaxing or asleep.  She has noted rates as low as 35 bpm when sleeping.  She is typically not symptomatic when she receives the alerts.  Her blood pressure has been normal.  Past Medical History:  Diagnosis Date  . ADD (attention deficit disorder)   . Chronic sinus infection   . Iron deficiency   . Irritable bowel syndrome   . Personal history of urinary (tract) infection   . Raynaud's disease   . Seasonal allergies   . Urinary frequency 05/15/11   History reviewed. No pertinent surgical history.   Current Meds  Medication Sig  . amphetamine-dextroamphetamine (ADDERALL) 10 MG tablet Take 10 mg by mouth daily with breakfast.  . ferrous sulfate 324 MG TBEC Take 324 mg by mouth daily.  Marland Kitchen ibuprofen (ADVIL) 200 MG tablet Take 600 mg by mouth every 6 (six) hours as needed.  . loratadine (CLARITIN) 10 MG tablet Take 10 mg by mouth daily.  . valACYclovir (VALTREX) 500 MG tablet Take by mouth as needed.     Allergies:   Ciprofloxacin hcl and Nitrofurantoin   Social History   Tobacco Use  . Smoking status: Never Smoker  . Smokeless tobacco: Never Used  Substance Use Topics  . Alcohol use: Not on file    Comment: some  weeks  . Drug use: No     Family Hx: The patient's family history includes Graves' disease in her mother; Hypertension in her mother; Other in her father.  ROS:   Please see the history of present illness.   All other systems reviewed and are negative.   Prior CV studies:   The following studies were reviewed today:  EP Procedures and Devices:  14-day event monitor (01/11/2019): Predominantly sinus rhythm with rare PACs and PVCs as well as episodes of ectopic atrial rhythm and  accelerated idioventricular rhythm.  Non-Invasive Evaluation(s):  Renal artery Doppler (01/12/2019): Normal study without evidence of renal artery stenosis.  TTE (01/12/2019): Normal LV size and wall thickness.  LVEF 55-60% with normal diastolic function.  Normal RV size and function.  No significant valvular abnormality.  Labs/Other Tests and Data Reviewed:    EKG:  No ECG reviewed.  Recent Labs: No results found for requested labs within last 8760 hours.   Recent Lipid Panel No results found for: CHOL, TRIG, HDL, CHOLHDL, LDLCALC, LDLDIRECT  Wt Readings from Last 3 Encounters:  09/01/19 147 lb (66.7 kg)  02/02/19 155 lb 8 oz (70.5 kg)  12/22/18 157 lb 4 oz (71.3 kg)     Objective:    Vital Signs:  Ht 5\' 7"  (1.702 m)   Wt 147 lb (66.7 kg)   BMI 23.02 kg/m    VITAL SIGNS:  reviewed  ASSESSMENT & PLAN:    Bradycardia: Asymptomatic and likely benign.  We will defer further testing and intervention at this time.  Heart-rate lowering medications should be avoided.  Atypical chest pain and Raynaud's phenomenon: Pain is stable and not consistent with ischemic heart disease.  Ms. wonders if her chest pain could be related to Raynaud's phenomenon.  It is conceivable that she also has intermittent coronary vasospasm, though the positional nature of her chest pain makes this seem unlikely.  Nonetheless, we have agreed to an empiric trial of amlodipine 2.5 mg daily.  Hypertension: [name] reports that her blood pressure has been well-controlled.  We will add low-dose amlodipine, as outlined above, for treatment of Raynaud's and possible coronary vasospasm.  Time:   Today, I have spent 14 minutes with the patient with telehealth technology discussing the above problems.     Medication Adjustments/Labs and Tests Ordered: Current medicines are reviewed at length with the patient today.  Concerns regarding medicines are outlined above.   Tests Ordered: None  Medication  Changes: Meds ordered this encounter  Medications  . amLODipine (NORVASC) 2.5 MG tablet    Sig: Take 1 tablet (2.5 mg total) by mouth daily.    Dispense:  90 tablet    Refill:  1    Follow Up:  In Person in 3 month(s)  Signed, Meredeth Ide, MD  09/01/2019 1:54 PM    [facility]

## 2019-09-01 NOTE — Patient Instructions (Signed)
Medication Instructions:  Your physician has recommended you make the following change in your medication:  1- START Amlodipine 2.5 mg by mouth once a day for Raynaud's and chest pain.   *If you need a refill on your cardiac medications before your next appointment, please call your pharmacy*   Follow-Up: At [facility], you and your health needs are our priority.  As part of our continuing mission to provide you with exceptional heart care, we have created designated Provider Care Teams.  These Care Teams include your primary Cardiologist (physician) and Advanced Practice Providers (APPs -  Physician Assistants and Nurse Practitioners) who all work together to provide you with the care you need, when you need it.  We recommend signing up for the patient portal called "MyChart".  Sign up information is provided on this After Visit Summary.  MyChart is used to connect with patients for Virtual Visits (Telemedicine).  Patients are able to view lab/test results, encounter notes, upcoming appointments, etc.  Non-urgent messages can be sent to your provider as well.   To learn more about what you can do with MyChart, go to [email or web address].    Your next appointment:   3 month(s)  The format for your next appointment:   In Person  Provider:    You may see Yvonne Kendall, MD or one of the following Advanced Practice Providers on your designated Care Team:    Nicolasa Ducking, NP  Eula Listen, PA-C  Marisue Ivan, PA-C

## 2019-09-01 NOTE — Progress Notes (Signed)
Spoke with patient and she verbalized understanding of AVS instructions. F/u appointment scheduled.

## 2019-09-08 ENCOUNTER — Emergency Department (HOSPITAL_BASED_OUTPATIENT_CLINIC_OR_DEPARTMENT_OTHER)
Admission: EM | Admit: 2019-09-08 | Discharge: 2019-09-09 | Disposition: A | Discharge status: Home / Self Care | Payer: BC Managed Care – PPO | Attending: Emergency Medicine | Admitting: Emergency Medicine

## 2019-09-08 ENCOUNTER — Encounter (HOSPITAL_BASED_OUTPATIENT_CLINIC_OR_DEPARTMENT_OTHER): Payer: Self-pay | Admitting: *Deleted

## 2019-09-08 ENCOUNTER — Other Ambulatory Visit: Payer: Self-pay

## 2019-09-08 ENCOUNTER — Emergency Department (HOSPITAL_BASED_OUTPATIENT_CLINIC_OR_DEPARTMENT_OTHER): Payer: BC Managed Care – PPO

## 2019-09-08 DIAGNOSIS — R0789 Other chest pain: Secondary | ICD-10-CM | POA: Diagnosis not present

## 2019-09-08 DIAGNOSIS — I10 Essential (primary) hypertension: Secondary | ICD-10-CM | POA: Diagnosis not present

## 2019-09-08 DIAGNOSIS — Z79899 Other long term (current) drug therapy: Secondary | ICD-10-CM | POA: Insufficient documentation

## 2019-09-08 DIAGNOSIS — F909 Attention-deficit hyperactivity disorder, unspecified type: Secondary | ICD-10-CM | POA: Insufficient documentation

## 2019-09-08 DIAGNOSIS — R202 Paresthesia of skin: Secondary | ICD-10-CM

## 2019-09-08 DIAGNOSIS — R079 Chest pain, unspecified: Secondary | ICD-10-CM | POA: Diagnosis present

## 2019-09-08 LAB — URINALYSIS, ROUTINE W REFLEX MICROSCOPIC
Bilirubin Urine: NEGATIVE
Glucose, UA: NEGATIVE mg/dL
Hgb urine dipstick: NEGATIVE
Ketones, ur: NEGATIVE mg/dL
Leukocytes,Ua: NEGATIVE
Nitrite: NEGATIVE
Protein, ur: NEGATIVE mg/dL
Specific Gravity, Urine: 1.015 (ref 1.005–1.030)
pH: 6 (ref 5.0–8.0)

## 2019-09-08 LAB — BASIC METABOLIC PANEL
Anion gap: 12 (ref 5–15)
BUN: 14 mg/dL (ref 6–20)
CO2: 21 mmol/L — ABNORMAL LOW (ref 22–32)
Calcium: 9.4 mg/dL (ref 8.9–10.3)
Chloride: 102 mmol/L (ref 98–111)
Creatinine, Ser: 0.97 mg/dL (ref 0.44–1.00)
GFR calc Af Amer: >60 mL/min (ref >60)
GFR calc non Af Amer: >60 mL/min (ref >60)
Glucose, Bld: 81 mg/dL (ref 70–99)
Potassium: 3.7 mmol/L (ref 3.5–5.1)
Sodium: 135 mmol/L (ref 135–145)

## 2019-09-08 LAB — TROPONIN I (HIGH SENSITIVITY)
Troponin I (High Sensitivity): <2 ng/L (ref <18)
Troponin I (High Sensitivity): 2 ng/L (ref <18)

## 2019-09-08 LAB — CBC
HCT: 46.8 % — ABNORMAL HIGH (ref 36.0–46.0)
Hemoglobin: 16.2 g/dL — ABNORMAL HIGH (ref 12.0–15.0)
MCH: 31.1 pg (ref 26.0–34.0)
MCHC: 34.6 g/dL (ref 30.0–36.0)
MCV: 89.8 fL (ref 80.0–100.0)
Platelets: 223 10*3/uL (ref 150–400)
RBC: 5.21 MIL/uL — ABNORMAL HIGH (ref 3.87–5.11)
RDW: 12 % (ref 11.5–15.5)
WBC: 10.7 10*3/uL — ABNORMAL HIGH (ref 4.0–10.5)
nRBC: 0 % (ref 0.0–0.2)

## 2019-09-08 LAB — PREGNANCY, URINE: Preg Test, Ur: NEGATIVE

## 2019-09-08 MED ORDER — IBUPROFEN 400 MG PO TABS
600.0000 mg | ORAL_TABLET | Freq: Once | ORAL | Status: AC
  Administered 2019-09-08: 22:00:00 600 mg via ORAL
  Filled 2019-09-08: qty 1

## 2019-09-08 MED ORDER — LIDOCAINE VISCOUS HCL 2 % MT SOLN
15.0000 mL | Freq: Once | OROMUCOSAL | Status: AC
  Administered 2019-09-08: 15 mL via ORAL
  Filled 2019-09-08: qty 15

## 2019-09-08 MED ORDER — ALUM & MAG HYDROXIDE-SIMETH 200-200-20 MG/5ML PO SUSP
30.0000 mL | Freq: Once | ORAL | Status: AC
  Administered 2019-09-08: 30 mL via ORAL
  Filled 2019-09-08: qty 30

## 2019-09-08 NOTE — ED Notes (Signed)
Pt transported to CT ?

## 2019-09-08 NOTE — ED Triage Notes (Signed)
Pain in the center of her chest today. She has pain in the right side of her neck, and arm. The right side of her face has felt numb x 30 minutes.

## 2019-09-08 NOTE — ED Provider Notes (Signed)
[facility] EMERGENCY DEPARTMENT Provider Note   CSN: [id number] Arrival date & time: 09/08/19  1945     History Chief Complaint  Patient presents with   Chest Pain    Kimberly Larson is a 29 y.o. female history IBS, rainouts disease, ADD, hypertension.  Patient presents today for chest pain, she reports that she has chronic chest pain for which she has been evaluated by cardiology in the past.  She reports that she has pain approximately every 2 days.  Pain today began when she first woke up and has been waxing and waning throughout the day, she describes a central chest squeezing sensation nonradiating no clear aggravating or alleviating factors and no radiation of pain.  She reports around 4 PM today her pain had become worse, she feels that it was worsened by cold weather.  Pain became severe in nature radiating up towards her right jaw.  Then around 7 PM pain had continued to worsen and was associated with paresthesias of her right lower face and right arm which have continued.  Patient reports that she had a televisit with her cardiologist last week and was prescribed amlodipine for Raynaud's and chest pain but she has not been taking this medication yet as she wanted a second opinion.  Additionally patient reports a mild bilateral throbbing headache that began after nurses drew blood in the ER today.  Denies fever/chills, vision changes, difficulty speaking, balance issues, neck stiffness, sore throat, cough/hemoptysis, extremity swelling, vomiting/diarrhea or any additional concerns. HPI     Past Medical History:  Diagnosis Date   ADD (attention deficit disorder)    Chronic sinus infection    Iron deficiency    Irritable bowel syndrome    Personal history of urinary (tract) infection    Raynaud's disease    Seasonal allergies    Urinary frequency 05/15/11    Patient Active Problem List   Diagnosis Date Noted   Raynaud's phenomenon without gangrene  09/01/2019   Bradycardia 12/22/2018   Atypical chest pain 12/22/2018   Essential hypertension 12/22/2018   Urinary frequency 12/17/2011   Night terrors 12/17/2011   ADD (attention deficit disorder)    Seasonal allergies    Recurrent URI (upper respiratory infection)     History reviewed. No pertinent surgical history.   OB History   No obstetric history on file.     Family History  Problem Relation Age of Onset   Hypertension Mother    Luiz Blare' disease Mother    Other Father        trigeminal neuralgia    Social History   Tobacco Use   Smoking status: Never Smoker   Smokeless tobacco: Never Used  Substance Use Topics   Alcohol use: Yes    Alcohol/week: 4.0 standard drinks    Types: 4 Cans of beer per week    Comment: some weeks   Drug use: No    Home Medications Prior to Admission medications   Medication Sig Start Date End Date Taking? Authorizing Provider  amLODipine (NORVASC) 2.5 MG tablet Take 1 tablet (2.5 mg total) by mouth daily. 09/01/19 11/30/19  End, Cristal Deer, MD  amphetamine-dextroamphetamine (ADDERALL) 10 MG tablet Take 10 mg by mouth daily with breakfast.    [provider]  ferrous sulfate 324 MG TBEC Take 324 mg by mouth daily.    [provider]  ibuprofen (ADVIL) 200 MG tablet Take 600 mg by mouth every 6 (six) hours as needed.    [provider]  loratadine (CLARITIN) 10 MG tablet Take 10 mg by mouth daily.    [provider]  valACYclovir (VALTREX) 500 MG tablet Take by mouth as needed.    [provider]    Allergies    Ciprofloxacin hcl and Nitrofurantoin  Review of Systems   Review of Systems Ten systems are reviewed and are negative for acute change except as noted in the HPI  Physical Exam Updated Vital Signs BP 127/90    Pulse (!) 48    Temp 98.8 F (37.1 C) (Oral)    Resp 19    Ht 5\' 7"  (1.702 m)    Wt 66.7 kg    LMP 08/23/2019    SpO2 100%    BMI 23.03 kg/m   Physical  Exam Constitutional:      General: She is not in acute distress.    Appearance: Normal appearance. She is well-developed. She is not ill-appearing or diaphoretic.  HENT:     Head: Normocephalic and atraumatic.     Right Ear: External ear normal.     Left Ear: External ear normal.     Nose: Nose normal.  Eyes:     General: Vision grossly intact. Gaze aligned appropriately.     Pupils: Pupils are equal, round, and reactive to light.  Neck:     Trachea: Trachea and phonation normal. No tracheal deviation.  Cardiovascular:     Rate and Rhythm: Normal rate and regular rhythm.     Pulses:          Radial pulses are 2+ on the right side and 2+ on the left side.       Dorsalis pedis pulses are 2+ on the right side and 2+ on the left side.     Heart sounds: Normal heart sounds.  Pulmonary:     Effort: Pulmonary effort is normal. No respiratory distress.     Breath sounds: Normal breath sounds.  Abdominal:     General: There is no distension.     Palpations: Abdomen is soft.     Tenderness: There is no abdominal tenderness. There is no guarding or rebound.  Musculoskeletal:        General: Normal range of motion.     Cervical back: Normal range of motion.     Right lower leg: No tenderness. No edema.     Left lower leg: No tenderness. No edema.  Skin:    General: Skin is warm and dry.     Comments: Purple appearance to the fingers which patient points is consistent with her Raynaud's disease  Neurological:     Mental Status: She is alert.     GCS: GCS eye subscore is 4. GCS verbal subscore is 5. GCS motor subscore is 6.     Comments: Mental Status: Alert, oriented, thought content appropriate, able to give a coherent history. Speech fluent without evidence of aphasia. Able to follow 2 step commands without difficulty. Cranial Nerves: II: Peripheral visual fields grossly normal, pupils equal, round, reactive to light III,IV, VI: ptosis not present, extra-ocular motions intact  bilaterally V,VII: smile symmetric, eyebrows raise symmetric, patient endorses decreased sensation to V2/V3 distribution right side. VIII: hearing grossly normal to voice X: uvula elevates symmetrically XI: bilateral shoulder shrug symmetric and strong XII: midline tongue extension without fassiculations Motor: Normal tone. 5/5 strength in upper and lower extremities bilaterally including strong and equal grip strength and dorsiflexion/plantar flexion Sensory: Sensation intact to light touch in all extremities. Cerebellar: normal finger-to-nose with  bilateral upper extremities. Normal heel-to -shin balance bilaterally of the lower extremity. No pronator drift.  Gait: normal gait and balance CV: distal pulses palpable throughout  Psychiatric:        Behavior: Behavior normal.     ED Results / Procedures / Treatments   Labs (all labs ordered are listed, but only abnormal results are displayed) Labs Reviewed  CBC - Abnormal; Notable for the following components:      Result Value   WBC 10.7 (*)    RBC 5.21 (*)    Hemoglobin 16.2 (*)    HCT 46.8 (*)    All other components within normal limits  BASIC METABOLIC PANEL - Abnormal; Notable for the following components:   CO2 21 (*)    All other components within normal limits  URINALYSIS, ROUTINE W REFLEX MICROSCOPIC  PREGNANCY, URINE  TROPONIN I (HIGH SENSITIVITY)  TROPONIN I (HIGH SENSITIVITY)    EKG EKG Interpretation  Date/Time:  Thursday September 08 2019 19:49:11 EDT Ventricular Rate:  56 PR Interval:  134 QRS Duration: 102 QT Interval:  436 QTC Calculation: 420 R Axis:   85 Text Interpretation: Sinus bradycardia Incomplete right bundle branch block Borderline ECG Artifact No previous ECGs available Confirmed by Alvira Monday ([id number]) on 09/08/2019 10:34:55 PM   Radiology DG Chest 2 View  Result Date: 09/08/2019 CLINICAL DATA:  Chest pain. EXAM: CHEST - 2 VIEW COMPARISON:  None. FINDINGS: Very mild atelectasis  and/or early infiltrate is seen within the right lung base. There is no evidence of a pleural effusion or pneumothorax. The heart size and mediastinal contours are within normal limits. The visualized skeletal structures are unremarkable. IMPRESSION: No active cardiopulmonary disease. Electronically Signed   By: Aram Candela M.D.   On: 09/08/2019 22:05    Procedures Procedures (including critical care time)  Medications Ordered in ED Medications  ibuprofen (ADVIL) tablet 600 mg (600 mg Oral Given 09/08/19 2212)  alum & mag hydroxide-simeth (MAALOX/MYLANTA) 200-200-20 MG/5ML suspension 30 mL (30 mLs Oral Given 09/08/19 2311)    And  lidocaine (XYLOCAINE) 2 % viscous mouth solution 15 mL (15 mLs Oral Given 09/08/19 2311)    ED Course  I have reviewed the triage vital signs and the nursing notes.  Pertinent labs & imaging results that were available during my care of the patient were reviewed by me and considered in my medical decision making (see chart for details).    MDM Rules/Calculators/A&P                     I have reviewed patient's chart, she had a telemedicine visit on September 01, 2019 with cardiologist Dr. Okey Dupre.  Per assessment and plan, bradycardia thought asymptomatic and benign and further testing was deferred, they plan to avoid heart rate lowering medications.  Atypical chest pain and now disease was not consistent with ischemic heart disease, there was thought to possible coronary vasospasm but positional nature of chest pain makes this unlikely, I have a begin patient on empiric trial of amlodipine 2.5 mg daily.  Patient's hypertension appears to be well controlled. ----- Initial troponin within normal limits.  Pregnancy test negative.  Urinalysis within normal limits.  BMP shows no emergent electrolyte derangement or evidence of kidney injury.  CBC shows nonspecific leukocytosis of 10.7, hemoglobin 16.2 no acute findings.    EKG: Sinus bradycardia Incomplete right bundle  branch block Borderline ECG Artifact No previous ECGs available Confirmed by Alvira Monday ([id number]) on 09/08/2019 10:34:55 PM  Chest  x-ray:  IMPRESSION:  No active cardiopulmonary disease.  I personally viewed patient chest x-ray and agree with radiology interpretation. - Patient endorsing new headaches that she had blood draw earlier.  Will give ibuprofen 60 mg for treatment, additionally symptoms today appear atypical will give GI cocktail and reassess.  CT head ordered for evaluation of V2/V3 paresthesias, additionally patient will need delta troponin.  These studies are pending at shift change, care handoff given to Dr. Florina Ou who will follow up on pending labs and imaging and dispo.  Case was discussed with Dr. Billy Fischer during this visit.  Note: Portions of this report may have been transcribed using voice recognition software. Every effort was made to ensure accuracy; however, inadvertent computerized transcription errors may still be present.  Final Clinical Impression(s) / ED Diagnoses Final diagnoses:  None    Rx / DC Orders ED Discharge Orders    None       Gari Crown 09/08/19 2320    Molpus, Jenny Reichmann, MD 09/08/19 2354

## 2019-10-12 ENCOUNTER — Telehealth: Payer: Self-pay | Admitting: Internal Medicine

## 2019-10-12 NOTE — Telephone Encounter (Signed)
That is fine.  She can follow-up prn.  Yvonne Kendall, MD [facility]

## 2019-10-12 NOTE — Telephone Encounter (Signed)
Relayed message from Dr. Okey Dupre. Pt had no further questions at this time. Cancelled august appt as indicated.

## 2019-10-12 NOTE — Telephone Encounter (Signed)
S/w patient. She was mainly calling to update Dr End. Reports she took amlodipine for the whole month of May. Caused swelling in her ankles. She also did not feel it helped her chest pain or Raynauds. She decided to discontinue it.  She has appointment a week from Friday with her PCP to further discuss her concerns from her ED visit. She does not feel she need follow up with cardiology at this time. Simply wanted to Dr End to be aware.

## 2019-10-12 NOTE — Telephone Encounter (Signed)
Patient calling in after ED visit back on 4/29. Patient went for chest pain and right arm/face went numb. Patient has had continued pain on right side and followed up with PCP. Everything was done through a [facility] provider so notes and findings will be in Epic. Please advise as appropriate

## 2019-10-13 ENCOUNTER — Other Ambulatory Visit: Payer: Self-pay | Admitting: Nurse Practitioner

## 2019-10-13 DIAGNOSIS — R918 Other nonspecific abnormal finding of lung field: Secondary | ICD-10-CM

## 2019-10-14 ENCOUNTER — Other Ambulatory Visit: Payer: Self-pay

## 2019-10-14 ENCOUNTER — Ambulatory Visit (INDEPENDENT_AMBULATORY_CARE_PROVIDER_SITE_OTHER): Payer: BC Managed Care – PPO

## 2019-10-14 DIAGNOSIS — R918 Other nonspecific abnormal finding of lung field: Secondary | ICD-10-CM | POA: Diagnosis not present

## 2019-10-14 MED ORDER — IOHEXOL 300 MG/ML  SOLN
100.0000 mL | Freq: Once | INTRAMUSCULAR | Status: AC | PRN
  Administered 2019-10-14: 100 mL via INTRAVENOUS

## 2019-10-20 ENCOUNTER — Telehealth: Payer: Self-pay | Admitting: Gastroenterology

## 2019-10-20 NOTE — Telephone Encounter (Signed)
Hey Dr. Russella Dar- this patient has had her records sent over to [place]. She states she would like to see you because her mom is a patient of yours. She recently was a patient at [facility] for GI health but moved closer to here and wants to transfer care. She has had a colonoscopy back in 2019 and states that she had a CT Chest done (6/4 in Epic.) that showed something on her liver. She states that her previous GI wanted her to have a procedure done because she thinks she got a false negative celiac test back. Patient states that she has IBS, bloated and clumps of mucus when she uses restroom. I will send records up to you for review. Please advise on scheduling. Thank you!

## 2019-10-20 NOTE — Telephone Encounter (Signed)
Recieved records for patient- will be in blue folder, LVM for patient to call back to let us know what she needs to be seen for and if she has a MD preference.

## 2019-10-21 ENCOUNTER — Encounter: Payer: Self-pay | Admitting: Gastroenterology

## 2019-10-21 NOTE — Telephone Encounter (Signed)
Records reviewed including colonoscopy, office notes, blood work, KUB however EGD was not included. Also no abd/pelvic CT or abd [place]. Please obtain EGD and associated path. If abd CT or abd US performed please obtain as well. OK to schedule.  Please make her aware other providers in our practice may have more availability that I do and that APP visits are part of our practice office appointments.

## 2019-10-21 NOTE — Telephone Encounter (Signed)
Spoke with patient, she is scheduled for 8/5- was okay waiting until then. Patient states that she did not have a EGD done, she was going to but ended up moving before it got done. Also state that her PCP that she seen today ordered a CT of the abd. I gave her fax # and they are supposed to send results once it is completed so we will have it for appointment.

## 2019-10-26 ENCOUNTER — Other Ambulatory Visit: Payer: Self-pay | Admitting: Nurse Practitioner

## 2019-10-26 DIAGNOSIS — R195 Other fecal abnormalities: Secondary | ICD-10-CM

## 2019-10-26 DIAGNOSIS — R1011 Right upper quadrant pain: Secondary | ICD-10-CM

## 2019-10-28 ENCOUNTER — Other Ambulatory Visit: Payer: Self-pay

## 2019-10-28 ENCOUNTER — Ambulatory Visit (INDEPENDENT_AMBULATORY_CARE_PROVIDER_SITE_OTHER): Payer: BC Managed Care – PPO

## 2019-10-28 DIAGNOSIS — R1011 Right upper quadrant pain: Secondary | ICD-10-CM

## 2019-10-28 DIAGNOSIS — R195 Other fecal abnormalities: Secondary | ICD-10-CM | POA: Diagnosis not present

## 2019-12-15 ENCOUNTER — Encounter: Payer: Self-pay | Admitting: Gastroenterology

## 2019-12-15 ENCOUNTER — Ambulatory Visit: Payer: BC Managed Care – PPO | Admitting: Gastroenterology

## 2019-12-15 ENCOUNTER — Other Ambulatory Visit (INDEPENDENT_AMBULATORY_CARE_PROVIDER_SITE_OTHER): Payer: BC Managed Care – PPO

## 2019-12-15 VITALS — BP 120/60 | HR 60 | Ht 67.0 in | Wt 148.0 lb

## 2019-12-15 DIAGNOSIS — R197 Diarrhea, unspecified: Secondary | ICD-10-CM | POA: Diagnosis not present

## 2019-12-15 DIAGNOSIS — K921 Melena: Secondary | ICD-10-CM

## 2019-12-15 DIAGNOSIS — R1030 Lower abdominal pain, unspecified: Secondary | ICD-10-CM | POA: Diagnosis not present

## 2019-12-15 LAB — CBC WITH DIFFERENTIAL/PLATELET
Basophils Absolute: 0 10*3/uL (ref 0.0–0.1)
Basophils Relative: 0.7 % (ref 0.0–3.0)
Eosinophils Absolute: 0.1 10*3/uL (ref 0.0–0.7)
Eosinophils Relative: 1.2 % (ref 0.0–5.0)
HCT: 42.1 % (ref 36.0–46.0)
Hemoglobin: 14.6 g/dL (ref 12.0–15.0)
Lymphocytes Relative: 38.2 % (ref 12.0–46.0)
Lymphs Abs: 2.5 10*3/uL (ref 0.7–4.0)
MCHC: 34.6 g/dL (ref 30.0–36.0)
MCV: 88.9 fl (ref 78.0–100.0)
Monocytes Absolute: 0.5 10*3/uL (ref 0.1–1.0)
Monocytes Relative: 7.6 % (ref 3.0–12.0)
Neutro Abs: 3.5 10*3/uL (ref 1.4–7.7)
Neutrophils Relative %: 52.3 % (ref 43.0–77.0)
Platelets: 230 10*3/uL (ref 150.0–400.0)
RBC: 4.73 Mil/uL (ref 3.87–5.11)
RDW: 12.8 % (ref 11.5–15.5)
WBC: 6.6 10*3/uL (ref 4.0–10.5)

## 2019-12-15 LAB — COMPREHENSIVE METABOLIC PANEL
ALT: 9 U/L (ref 0–35)
AST: 23 U/L (ref 0–37)
Albumin: 4.7 g/dL (ref 3.5–5.2)
Alkaline Phosphatase: 51 U/L (ref 39–117)
BUN: 14 mg/dL (ref 6–23)
CO2: 26 mEq/L (ref 19–32)
Calcium: 9.5 mg/dL (ref 8.4–10.5)
Chloride: 104 mEq/L (ref 96–112)
Creatinine, Ser: 0.95 mg/dL (ref 0.40–1.20)
GFR: 69.87 mL/min (ref >60.00)
Glucose, Bld: 87 mg/dL (ref 70–99)
Potassium: 4 mEq/L (ref 3.5–5.1)
Sodium: 138 mEq/L (ref 135–145)
Total Bilirubin: 0.8 mg/dL (ref 0.2–1.2)
Total Protein: 7 g/dL (ref 6.0–8.3)

## 2019-12-15 LAB — TSH: TSH: 1.43 u[IU]/mL (ref 0.35–4.50)

## 2019-12-15 LAB — VITAMIN B12: Vitamin B-12: 276 pg/mL (ref 211–911)

## 2019-12-15 LAB — SEDIMENTATION RATE: Sed Rate: 5 mm/hr (ref 0–20)

## 2019-12-15 LAB — C-REACTIVE PROTEIN: CRP: <1 mg/dL (ref 0.5–20.0)

## 2019-12-15 MED ORDER — NA SULFATE-K SULFATE-MG SULF 17.5-3.13-1.6 GM/177ML PO SOLN
1.0000 | Freq: Once | ORAL | 0 refills | Status: AC
Start: 2019-12-15 — End: 2019-12-15

## 2019-12-15 MED ORDER — DICYCLOMINE HCL 10 MG PO CAPS
10.0000 mg | ORAL_CAPSULE | Freq: Three times a day (TID) | ORAL | 11 refills | Status: DC
Start: 2019-12-15 — End: 2021-05-30

## 2019-12-15 NOTE — Progress Notes (Signed)
History of Present Illness: This is a 29 year old female self referred for the evaluation of lower abdominal pain, abdominal bloating, abdominal distention, hematochezia and diarrhea.  She was previously followed by Dr. Marylu Lund in [place], [place] and carries a diagnosis of GERD and IBS-C.  She relates she had alternating bowel pattern that was predominantly constipation and she was treated with various laxatives with control of symptoms.  Omeprazole effectively controlled her GERD symptoms.  Her symptoms significantly changed 2 to 3 months ago and she now has diarrhea 4-5 times a day without constipation.  She has noted small amounts of bright red blood mixed with diarrhea and occasionally with mucus mucus.  On one occasion she noted a dark, black stool.  She provided photos for me to review from her phone with several bowel movements over the past 6 weeks showing mucus with stool,  flecks of red material in the commode and 1 black stool.  She has significant lower abdominal bloating, lower abdominal distention and pain.  The symptoms are frequent and intermittent.  Previous blood work showed IgA low at 8, negative celiac serologies and CRP=7.  Her CMP, CBC, TSH were normal.  She relates an intentional gradual 50 pound weight loss over the past 5 years.  Her weight has been stable over the past several months.  No active reflux symptoms and no longer on reflux medications.  Chest CT on 10/14/2019 showed a nonspecific 4 mm right lower lobe nodule and a calcified granuloma in the superior aspect of the liver.  Abdominal ultrasound on 10/28/2019 showed a probable calcified granuloma in the right hepatic lobe measuring 1.8 cm and no other abnormalities.  Denies change in stool caliber, nausea, vomiting, dysphagia, reflux symptoms, chest pain.   Colonoscopy 07/2017: 2 polyps, rectal erythema o/w normal, TI normal. Path: Random colonic biopsies showed no evidence of collagenous or lymphocytic colitis, erythematous  rectal mucosa was normal, 1 polyp was inflammed mucosa and the other polyp was hyperplastic    Allergies  Allergen Reactions  . Ciprofloxacin Hcl Nausea Only  . Nitrofurantoin Other (See Comments)    Other reaction(s): Vomiting   Outpatient Medications Prior to Visit  Medication Sig Dispense Refill  . amphetamine-dextroamphetamine (ADDERALL) 10 MG tablet Take 10 mg by mouth daily with breakfast.    . ferrous sulfate 324 MG TBEC Take 324 mg by mouth daily.    . valACYclovir (VALTREX) 500 MG tablet Take by mouth as needed.    Marland Kitchen amLODipine (NORVASC) 2.5 MG tablet Take 1 tablet (2.5 mg total) by mouth daily. 90 tablet 1  . ibuprofen (ADVIL) 200 MG tablet Take 600 mg by mouth every 6 (six) hours as needed.    . loratadine (CLARITIN) 10 MG tablet Take 10 mg by mouth daily.     No facility-administered medications prior to visit.   Past Medical History:  Diagnosis Date  . ADD (attention deficit disorder)   . Chronic sinus infection   . GERD (gastroesophageal reflux disease)   . Hypertension   . Iron deficiency   . Irritable bowel syndrome   . Personal history of urinary (tract) infection   . Raynaud's disease   . Seasonal allergies   . Urinary frequency 05/15/11   Past Surgical History:  Procedure Laterality Date  . WISDOM TOOTH EXTRACTION     Social History   Socioeconomic History  . Marital status: Single    Spouse name: Not on file  . Number of children: Not on file  .  Years of education: Not on file  . Highest education level: Not on file  Occupational History  . Not on file  Tobacco Use  . Smoking status: Never Smoker  . Smokeless tobacco: Never Used  Vaping Use  . Vaping Use: Never used  Substance and Sexual Activity  . Alcohol use: Yes    Alcohol/week: 4.0 standard drinks    Types: 4 Cans of beer per week    Comment: some weeks  . Drug use: No  . Sexual activity: Not on file  Other Topics Concern  . Not on file  Social History Narrative  . Not on file    Social Determinants of Health   Financial Resource Strain:   . Difficulty of Paying Living Expenses:   Food Insecurity:   . Worried About Charity fundraiser in the Last Year:   . Arboriculturist in the Last Year:   Transportation Needs:   . Film/video editor (Medical):   Marland Kitchen Lack of Transportation (Non-Medical):   Physical Activity:   . Days of Exercise per Week:   . Minutes of Exercise per Session:   Stress:   . Feeling of Stress :   Social Connections:   . Frequency of Communication with Friends and Family:   . Frequency of Social Gatherings with Friends and Family:   . Attends Religious Services:   . Active Member of Clubs or Organizations:   . Attends Archivist Meetings:   Marland Kitchen Marital Status:    Family History  Problem Relation Age of Onset  . Hypertension Mother   . Graves' disease Mother   . Crohn's disease Mother   . Irritable bowel syndrome Mother   . Other Father        trigeminal neuralgia  . Heart disease Maternal Grandmother   . Irritable bowel syndrome Maternal Grandmother   . Heart disease Maternal Grandfather   . Irritable bowel syndrome Maternal Grandfather      Review of Systems: Pertinent positive and negative review of systems were noted in the above HPI section. All other review of systems were otherwise negative.   Physical Exam: General: Well developed, well nourished, no acute distress Head: Normocephalic and atraumatic Eyes:  sclerae anicteric, EOMI Ears: Normal auditory acuity Mouth: Not examined, mask on during Covid-19 pandemic Neck: Supple, no masses or thyromegaly Lungs: Clear throughout to auscultation Heart: Regular rate and rhythm; no murmurs, rubs or bruits Abdomen: Soft, non tender and non distended. No masses, hepatosplenomegaly or hernias noted. Normal Bowel sounds Rectal: Deferred to colonoscopy Musculoskeletal: Symmetrical with no gross deformities  Skin: No lesions on visible extremities Pulses:  Normal  pulses noted Extremities: No clubbing, cyanosis, edema or deformities noted Neurological: Alert oriented x 4, grossly nonfocal Cervical Nodes:  No significant cervical adenopathy Inguinal Nodes: No significant inguinal adenopathy Psychological:  Alert and cooperative. Normal mood and affect   Assessment and Recommendations:  1. Diarrhea, change in bowel habits, lower abdominal pain, abdominal bloating, abdominal distention, hematochezia. History of IBS-C. Previously a majority of her symptoms were constipation however she did have episodic diarrhea.   R/O IBD, infectious diarrhea, celiac disease, IBS-D with hemorrhoids or other benign source of intermittent hematochezia.  Send HLA DQ, CBC, CMP, TSH, B12, ESR, CRP, GI stool profile. Lactose-free diet for 1 week.  Begin dicyclomine 10 mg p.o. AC 3 times daily as needed.  If no infectious etiology found proceed with colonoscopy. The risks (including bleeding, perforation, infection, missed lesions, medication reactions and  possible hospitalization or surgery if complications occur), benefits, and alternatives to colonoscopy with possible biopsy and possible polypectomy were discussed with the patient and they consent to proceed.  If HLA DQ results compatible with celiac disease we discussed EGD with duodenal biopsies to definitively exclude celiac disease. The risks (including bleeding, perforation, infection, missed lesions, medication reactions and possible hospitalization or surgery if complications occur), benefits, and alternatives to endoscopy with possible biopsy and possible dilation were discussed with the patient and they consent to proceed.   2. IgA deficiency.   3. 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

## 2019-12-15 NOTE — Patient Instructions (Signed)
Your provider has requested that you go to the basement level for lab work before leaving today. Press "B" on the elevator. The lab is located at the first door on the left as you exit the elevator.  We have sent the following medications to your pharmacy for you to pick up at your convenience: dicyclomine.   You have been scheduled for a colonoscopy. Please follow written instructions given to you at your visit today.  Please pick up your prep supplies at the pharmacy within the next 1-3 days. If you use inhalers (even only as needed), please bring them with you on the day of your procedure.   Thank you for choosing me and [facility].  Venita Lick. Pleas Koch., MD., Clementeen Graham

## 2019-12-16 ENCOUNTER — Other Ambulatory Visit: Payer: BC Managed Care – PPO

## 2019-12-16 DIAGNOSIS — R197 Diarrhea, unspecified: Secondary | ICD-10-CM

## 2019-12-16 DIAGNOSIS — K921 Melena: Secondary | ICD-10-CM

## 2019-12-16 DIAGNOSIS — R1030 Lower abdominal pain, unspecified: Secondary | ICD-10-CM

## 2019-12-19 LAB — GI PROFILE, STOOL, PCR

## 2019-12-21 LAB — CELIAC DISEASE HLA DQ ASSOC.
DQ2 (DQA1 0501/0505,DQB1 02XX): NEGATIVE
DQ8 (DQA1 03XX, DQB1 0302): POSITIVE

## 2019-12-29 ENCOUNTER — Ambulatory Visit: Payer: BC Managed Care – PPO | Admitting: Internal Medicine

## 2020-02-13 ENCOUNTER — Encounter: Payer: BC Managed Care – PPO | Admitting: Gastroenterology

## 2020-02-13 ENCOUNTER — Encounter: Payer: Self-pay | Admitting: Gastroenterology

## 2020-02-13 ENCOUNTER — Other Ambulatory Visit: Payer: Self-pay

## 2020-02-13 ENCOUNTER — Ambulatory Visit (AMBULATORY_SURGERY_CENTER): Payer: BC Managed Care – PPO | Admitting: Gastroenterology

## 2020-02-13 VITALS — BP 115/74 | HR 57 | Temp 97.8°F | Resp 17 | Ht 67.0 in | Wt 148.0 lb

## 2020-02-13 DIAGNOSIS — R197 Diarrhea, unspecified: Secondary | ICD-10-CM | POA: Diagnosis not present

## 2020-02-13 DIAGNOSIS — K640 First degree hemorrhoids: Secondary | ICD-10-CM | POA: Diagnosis not present

## 2020-02-13 DIAGNOSIS — K449 Diaphragmatic hernia without obstruction or gangrene: Secondary | ICD-10-CM

## 2020-02-13 DIAGNOSIS — K921 Melena: Secondary | ICD-10-CM | POA: Diagnosis not present

## 2020-02-13 DIAGNOSIS — K319 Disease of stomach and duodenum, unspecified: Secondary | ICD-10-CM

## 2020-02-13 DIAGNOSIS — R1030 Lower abdominal pain, unspecified: Secondary | ICD-10-CM

## 2020-02-13 DIAGNOSIS — R194 Change in bowel habit: Secondary | ICD-10-CM

## 2020-02-13 MED ORDER — SODIUM CHLORIDE 0.9 % IV SOLN: 500.0000 mL | INTRAVENOUS | Status: DC

## 2020-02-13 NOTE — Progress Notes (Signed)
Vs CW ° °

## 2020-02-13 NOTE — Progress Notes (Signed)
pt tolerated well. VSS. awake and to recovery. Report given to RN. Bite block placed while awake with no complaint. Left in to recovery.

## 2020-02-13 NOTE — Op Note (Signed)
[facility] Patient Name: Kimberly MichaelisBrooke Fleming Procedure Date: 02/13/2020 3:12 PM MRN: [id number] Endoscopist: Meryl DareMalcolm T Akilah Cureton , MD Age: [age] Referring MD:  Date of Birth: 05/06/1991 Gender: Female Account #: [id number] Procedure:                Colonoscopy Indications:              Lower abdominal pain, Clinically significant                            diarrhea of unexplained origin, Hematochezia,                            Change in bowel habits Medicines:                Monitored Anesthesia Care Procedure:                Pre-Anesthesia Assessment:                           - Prior to the procedure, a History and Physical                            was performed, and patient medications and                            allergies were reviewed. The patient's tolerance of                            previous anesthesia was also reviewed. The risks                            and benefits of the procedure and the sedation                            options and risks were discussed with the patient.                            All questions were answered, and informed consent                            was obtained. Prior Anticoagulants: The patient has                            taken no previous anticoagulant or antiplatelet                            agents. ASA Grade Assessment: II - A patient with                            mild systemic disease. After reviewing the risks                            and benefits, the patient was deemed in  satisfactory condition to undergo the procedure.                           After obtaining informed consent, the colonoscope                            was passed under direct vision. Throughout the                            procedure, the patient's blood pressure, pulse, and                            oxygen saturations were monitored continuously. The                            Colonoscope was introduced through the anus  and                            advanced to the the terminal ileum, with                            identification of the appendiceal orifice and IC                            valve. The terminal ileum, ileocecal valve,                            appendiceal orifice, and rectum were photographed.                            The quality of the bowel preparation was good. The                            colonoscopy was performed without difficulty. The                            patient tolerated the procedure well. Scope In: 3:15:40 PM Scope Out: 3:34:17 PM Scope Withdrawal Time: 0 hours 15 minutes 31 seconds  Total Procedure Duration: 0 hours 18 minutes 37 seconds  Findings:                 The perianal and digital rectal examinations were                            normal.                           The terminal ileum appeared normal.                           Internal hemorrhoids were found during                            retroflexion. The hemorrhoids were small and Grade  I (internal hemorrhoids that do not prolapse).                           The exam was otherwise without abnormality on                            direct and retroflexion views. Random biopsies                            obtained throughout. Complications:            No immediate complications. Estimated blood loss:                            None. Estimated Blood Loss:     Estimated blood loss: none. Impression:               - The examined portion of the ileum was normal.                           - Internal hemorrhoids.                           - The examination was otherwise normal on direct                            and retroflexion views. Biopsies obtained.                           - No specimens collected. Recommendation:           - Repeat colonoscopy at age 70 years for CRC                            screening purposes.                           - Patient has a contact number  available for                            emergencies. The signs and symptoms of potential                            delayed complications were discussed with the                            patient. Return to normal activities tomorrow.                            Written discharge instructions were provided to the                            patient.                           - Resume previous diet.                           -  Continue present medications.                           - Await pathology results.                           - Prep H supp PR HS prn hemorrhoidal symptoms. Meryl Dare, MD 02/13/2020 3:45:46 PM This report has been signed electronically.

## 2020-02-13 NOTE — Op Note (Signed)
[facility] Patient Name: Kimberly Larson Procedure Date: 02/13/2020 3:12 PM MRN: [id number] Endoscopist: Meryl Dare , MD Age: 29 Referring MD:  Date of Birth: 1990/10/31 Gender: Female Account #: [id number] Procedure:                Upper GI endoscopy Indications:              Lower abdominal pain, Diarrhea Medicines:                Monitored Anesthesia Care Procedure:                Pre-Anesthesia Assessment:                           - Prior to the procedure, a History and Physical                            was performed, and patient medications and                            allergies were reviewed. The patient's tolerance of                            previous anesthesia was also reviewed. The risks                            and benefits of the procedure and the sedation                            options and risks were discussed with the patient.                            All questions were answered, and informed consent                            was obtained. Prior Anticoagulants: The patient has                            taken no previous anticoagulant or antiplatelet                            agents. ASA Grade Assessment: II - A patient with                            mild systemic disease. After reviewing the risks                            and benefits, the patient was deemed in                            satisfactory condition to undergo the procedure.                           After obtaining informed consent, the endoscope was  passed under direct vision. Throughout the                            procedure, the patient's blood pressure, pulse, and                            oxygen saturations were monitored continuously. The                            Endoscope was introduced through the mouth, and                            advanced to the second part of duodenum. The upper                            GI endoscopy was  accomplished without difficulty.                            The patient tolerated the procedure well. Scope In: Scope Out: Findings:                 The examined esophagus was normal.                           Patchy mildly erythematous mucosa without bleeding                            was found in the gastric body. Biopsies were taken                            with a cold forceps for histology.                           A small hiatal hernia was present.                           The exam of the stomach was otherwise normal.                           The duodenal bulb and second portion of the                            duodenum were normal. Biopsies for histology were                            taken with a cold forceps for evaluation of celiac                            disease. Complications:            No immediate complications. Estimated Blood Loss:     Estimated blood loss was minimal. Impression:               - Normal esophagus.                           -  Erythematous mucosa in the gastric body. Biopsied.                           - Small hiatal hernia.                           - Normal duodenal bulb and second portion of the                            duodenum. Biopsied. Recommendation:           - Patient has a contact number available for                            emergencies. The signs and symptoms of potential                            delayed complications were discussed with the                            patient. Return to normal activities tomorrow.                            Written discharge instructions were provided to the                            patient.                           - Resume previous diet.                           - Continue present medications.                           - Await pathology results. Meryl Dare, MD 02/13/2020 3:48:45 PM This report has been signed electronically.

## 2020-02-13 NOTE — Patient Instructions (Signed)
Handouts given for gastritis, hiatal hernia and hemorrhoids.  YOU HAD AN ENDOSCOPIC PROCEDURE TODAY AT THE [facility]:   Refer to the procedure report that was given to you for any specific questions about what was found during the examination.  If the procedure report does not answer your questions, please call your gastroenterologist to clarify.  If you requested that your care partner not be given the details of your procedure findings, then the procedure report has been included in a sealed envelope for you to review at your convenience later.  YOU SHOULD EXPECT: Some feelings of bloating in the abdomen. Passage of more gas than usual.  Walking can help get rid of the air that was put into your GI tract during the procedure and reduce the bloating. If you had a lower endoscopy (such as a colonoscopy or flexible sigmoidoscopy) you may notice spotting of blood in your stool or on the toilet paper. If you underwent a bowel prep for your procedure, you may not have a normal bowel movement for a few days.  Please Note:  You might notice some irritation and congestion in your nose or some drainage.  This is from the oxygen used during your procedure.  There is no need for concern and it should clear up in a day or so.  SYMPTOMS TO REPORT IMMEDIATELY:   Following lower endoscopy (colonoscopy or flexible sigmoidoscopy):  Excessive amounts of blood in the stool  Significant tenderness or worsening of abdominal pains  Swelling of the abdomen that is new, acute  Fever of 100F or higher   Following upper endoscopy (EGD)  Vomiting of blood or coffee ground material  New chest pain or pain under the shoulder blades  Painful or persistently difficult swallowing  New shortness of breath  Fever of 100F or higher  Black, tarry-looking stools  For urgent or emergent issues, a gastroenterologist can be reached at any hour by calling [phone]. Do not use MyChart messaging for urgent  concerns.    DIET:  We do recommend a small meal at first, but then you may proceed to your regular diet.  Drink plenty of fluids but you should avoid alcoholic beverages for 24 hours.  ACTIVITY:  You should plan to take it easy for the rest of today and you should NOT DRIVE or use heavy machinery until tomorrow (because of the sedation medicines used during the test).    FOLLOW UP: Our staff will call the number listed on your records 48-72 hours following your procedure to check on you and address any questions or concerns that you may have regarding the information given to you following your procedure. If we do not reach you, we will leave a message.  We will attempt to reach you two times.  During this call, we will ask if you have developed any symptoms of COVID 19. If you develop any symptoms (ie: fever, flu-like symptoms, shortness of breath, cough etc.) before then, please call [phone].  If you test positive for Covid 19 in the 2 weeks post procedure, please call and report this information to [place].    If any biopsies were taken you will be contacted by phone or by letter within the next 1-3 weeks.  Please call us at [phone] if you have not heard about the biopsies in 3 weeks.    SIGNATURES/CONFIDENTIALITY: You and/or your care partner have signed paperwork which will be entered into your electronic medical record.  These signatures attest  to the fact that that the information above on your After Visit Summary has been reviewed and is understood.  Full responsibility of the confidentiality of this discharge information lies with you and/or your care-partner. 

## 2020-02-13 NOTE — Progress Notes (Signed)
Called to room to assist during endoscopic procedure.  Patient ID and intended procedure confirmed with present staff. Received instructions for my participation in the procedure from the performing physician.  

## 2020-02-15 ENCOUNTER — Telehealth: Payer: Self-pay

## 2020-02-15 NOTE — Telephone Encounter (Signed)
  Follow up Call-  Call back number 02/13/2020  Post procedure Call Back phone  # [phone]  Permission to leave phone message Yes  Some recent data might be hidden     Patient questions:  Do you have a fever, pain , or abdominal swelling? No. Pain Score  0 *  Have you tolerated food without any problems? Yes.    Have you been able to return to your normal activities? Yes.    Do you have any questions about your discharge instructions: Diet   No. Medications  No. Follow up visit  No.  Do you have questions or concerns about your Care? No.  Actions: * If pain score is 4 or above: No action needed, pain <4. 1. Have you developed a fever since your procedure? no  2.   Have you had an respiratory symptoms (SOB or cough) since your procedure? no  3.   Have you tested positive for COVID 19 since your procedure no  4.   Have you had any family members/close contacts diagnosed with the COVID 19 since your procedure?  no   If yes to any of these questions please route to Laverna Peace, RN and Karlton Lemon, RN

## 2020-02-21 ENCOUNTER — Encounter: Payer: Self-pay | Admitting: Gastroenterology

## 2020-07-06 ENCOUNTER — Telehealth: Payer: Self-pay | Admitting: Gastroenterology

## 2020-07-06 DIAGNOSIS — R197 Diarrhea, unspecified: Secondary | ICD-10-CM

## 2020-07-06 DIAGNOSIS — R1030 Lower abdominal pain, unspecified: Secondary | ICD-10-CM

## 2020-07-06 NOTE — Telephone Encounter (Signed)
Pt is requesting a call back from a nurse to see if the provider could order a food allergy test for her.

## 2020-07-06 NOTE — Telephone Encounter (Signed)
Patient would like to be referred for allergy testing to foods.

## 2020-07-09 NOTE — Telephone Encounter (Signed)
Allergy referral to Dr. Lucie Leather or Dr. Delorse Lek.

## 2020-07-09 NOTE — Telephone Encounter (Signed)
Patient notified she will be contacted directly by allergy and asthma to set up appointment.

## 2020-07-25 NOTE — Progress Notes (Signed)
New Patient Note  RE: Kimberly Larson MRN: [id number] DOB: 1991-01-08 Date of Office Visit: 07/26/2020  Referring provider: Meryl Dare, MD Primary care provider: Mitzi Hansen, NP  Chief Complaint: Allergic Rhinitis  and Abdominal Pain  History of Present Illness: I had the pleasure of seeing Kimberly Larson for initial evaluation at the [facility] on 07/26/2020. She is a 30 y.o. female, who is referred here by Dr. Russella Dar (GI) for the evaluation of possible food allergies.  Rash:  Patient has been having issues with unexplained rash on the neck and facial swelling which subsides within 1 day. This has been going on for many years. No triggers noted. This usually occurs about once a week or so.    Describes the rash as blotchy red, flat and non-pruritic. She apparently also has rosacea. No ecchymosis upon resolution. Associated symptoms include: unknown. Suspected triggers are unknown. Denies any fevers, chills, changes in medications, foods, personal care products or recent infections. She has tried the following therapies: none.   Abdominal: Patient follows with GI for abdominal issues including distention. She apparently has IBS.  Previous work up includes: patient had EGD and colonoscopy. Had some benign polyps, gastritis, IBS, GERD, hiatal hernia.  Tried Bentyl with no benefit.  Tried low FODMAP diet with some benefit.   Past work up includes: none.  Dietary History: patient has been eating other foods including milk, eggs, peanut, treenuts, sesame, shellfish, fish, soy, wheat, meats, fruits and vegetables.  Rhinitis: She reports symptoms of nasal congestion, rhinorrhea, PND, watery eyes. Symptoms have been going on for many years. The symptoms are present mainly in the spring. Anosmia: no. Headache: sometimes. She has used Claritin, Flonase with fair improvement in symptoms. Sinus infections: yes sometimes. Previous work up includes: none. Previous ENT  evaluation: no. History of reflux: yes and takes no medications for this.   Patient follows with cardiology for bradycardia.  Assessment and Plan: Kimberly Larson is a 30 y.o. female with: Abdominal bloating Patient follows with GI for abdominal bloating/distension, diarrhea and constipation. Had EGD/colonoscopy - polyps, gastritis, GERD, hiatal hernia, ? IBS. No specific food triggers noted. Tired Bentyl and fodmap diet with no significant improvement. Concerned about food allergies.  Discussed with patient length that skin prick testing and bloodwork (food IgE levels) check for IgE mediated reactions which her clinical presentation does not support.   Patient would still like to rule it out.   Today's skin testing showed: Negative to food panel. Results given.   Continue to follow up with GI and their recommendations. Keep a food journal with symptoms and foods eaten. Avoid foods that are bothersome - dairy. Continue with the FODMAP diet.   Facial swelling Patient endorses facial swelling, non-pruritic blotchy rash on the neck at times. No triggers noted. No respiratory compromise. Concerned abut allergic triggers.  Today's skin testing showed: Positive to grass pollen, tree pollen, cat dander. Negative to foods.  Keep track of episodes and take pictures - not sure what's causing this.   See below for proper skin care.  Get bloodwork to rule out other etiologies.   Rash and other nonspecific skin eruption  See assessment and plan as above.  Other allergic rhinitis Rhinoconjunctivitis symptoms in the spring and uses Claritin and Flonase with good benefit.  No prior allergy evaluation.  Today's skin testing showed: Positive to grass pollen, tree pollen, cat dander.  Start environmental control measures as below.  May use over the counter antihistamines such  as Zyrtec (cetirizine), Claritin (loratadine), Allegra (fexofenadine), or Xyzal (levocetirizine) daily as needed.  May use  Flonase (fluticasone) nasal spray 1 spray per nostril twice a day as needed for nasal congestion.   Return in about 4 months (around 11/25/2020).  No orders of the defined types were placed in this encounter.   Lab Orders     ANA w/Reflex     CBC with Differential/Platelet     Chronic Urticaria     Comprehensive metabolic panel     Tryptase     Alpha-Gal Panel     C1 Esterase Inhibitor     C3 and C4     C1 esterase inhibitor, functional  Other allergy screening: Asthma: no Medication allergy: yes Hymenoptera allergy: no Urticaria: no Eczema:no History of recurrent infections suggestive of immunodeficency: no  Diagnostics: Skin Testing: environmental and food panel. Positive to grass pollen, tree pollen, cat dander. Negative to foods. Results discussed with patient/family.  Airborne Adult Perc - 07/26/20 1026    Time Antigen Placed 1026    Allergen Manufacturer Greer    Location Back    Number of Test 59    Panel 1 Select    1. Control-Buffer 50% Glycerol Negative    2. Control-Histamine 1 mg/ml 2+    3. Albumin saline Negative    4. Bahia 3+    5. French Southern Territories Negative    6. Johnson 2+    7. Kentucky Blue 2+    8. Meadow Fescue 2+    9. Perennial Rye Negative    10. Sweet Vernal 2+    11. Timothy 4+    12. Cocklebur Negative    13. Burweed Marshelder Negative    14. Ragweed, short Negative    15. Ragweed, Giant Negative    16. Plantain,  English Negative    17. Lamb's Quarters Negative    18. Sheep Sorrell Negative    19. Rough Pigweed Negative    20. Marsh Elder, Rough Negative    21. Mugwort, Common Negative    22. Ash mix Negative    23. Birch mix Negative    24. Beech American Negative    25. Box, Elder Negative    26. Cedar, red Negative    27. Cottonwood, Guinea-Bissau Negative    28. Elm mix Negative    29. Hickory 3+    30. Maple mix Negative    31. Oak, Guinea-Bissau mix 2+    32. Pecan Pollen Negative    33. Pine mix Negative    34. Sycamore Eastern  Negative    35. Walnut, Black Pollen 2+    36. Alternaria alternata Negative    37. Cladosporium Herbarum Negative    38. Aspergillus mix Negative    39. Penicillium mix Negative    40. Bipolaris sorokiniana (Helminthosporium) Negative    41. Drechslera spicifera (Curvularia) Negative    42. Mucor plumbeus Negative    43. Fusarium moniliforme Negative    44. Aureobasidium pullulans (pullulara) Negative    45. Rhizopus oryzae Negative    46. Botrytis cinera Negative    47. Epicoccum nigrum Negative    48. Phoma betae Negative    49. Candida Albicans Negative    50. Trichophyton mentagrophytes Negative    51. Mite, D Farinae  5,000 AU/ml Negative    52. Mite, D Pteronyssinus  5,000 AU/ml Negative    53. Cat Hair 10,000 BAU/ml 2+    54.  Dog Epithelia Negative    55. Mixed Feathers  Negative    56. Horse Epithelia Negative    57. Cockroach, German Negative    58. Mouse Negative    59. Tobacco Leaf Negative          Food Adult Perc - 07/26/20 1000    Time Antigen Placed 1026    Allergen Manufacturer Waynette Buttery    Location Back    Number of allergen test 72    1. Peanut Negative    2. Soybean Negative    3. Wheat Negative    4. Sesame Negative    5. Milk, cow Negative    6. Egg White, Chicken Negative    7. Casein Negative    8. Shellfish Mix Negative    9. Fish Mix Negative    10. Cashew Negative    11. Pecan Food Negative    12. Walnut Food Negative    13. Almond Negative    14. Hazelnut Negative    15. Eston[place] nut Negative    16. Coconut Negative    17. Pistachio Negative    18. Catfish Negative    19. Bass Negative    20. Trout Negative    21. Tuna Negative    22. Salmon Negative    23. Flounder Negative    24. Codfish Negative    25. Shrimp Negative    26. Crab Negative    27. Lobster Negative    28. Oyster Negative    29. Scallops Negative    30. Barley Negative    31. Oat  Negative    32. Rye  Negative    33. Hops Negative    34. Rice Negative    35.  Cottonseed Negative    36. Saccharomyces Cerevisiae  Negative    37. Pork Negative    38. Mal[place] Meat Negative    39. Chicken Meat Negative    40. Beef Negative    41. Lamb Negative    42. Tomato Negative    43. White Potato Negative    44. Sweet Potato Negative    45. Pea, Green/English Negative    46. Navy Bean Negative    47. Mushrooms Negative    48. Avocado Negative    49. Onion Negative    50. Cabbage Negative    51. Carrots Negative    52. Celery Negative    53. Corn Negative    54. Cucumber Negative    55. Grape (White seedless) Negative    56. Orange  Negative    57. Banana Negative    58. Apple Negative    59. Peach Negative    60. Strawberry Negative    61. Cantaloupe Negative    62. Watermelon Negative    63. Pineapple Negative    64. Chocolate/Cacao bean Negative    65. Karaya Gum Negative    66. Acacia (Arabic Gum) Negative    67. Cinnamon Negative    68. Nutmeg Negative    69. Ginger Negative    70. Garlic Negative    71. Pepper, black Negative    72. Mustard Negative           Past Medical History: Patient Active Problem List   Diagnosis Date Noted   Adverse reaction to food, subsequent encounter 07/26/2020   Abdominal bloating 07/26/2020   Other allergic rhinitis 07/26/2020   Rash and other nonspecific skin eruption 07/26/2020   Facial swelling 07/26/2020   Raynaud's phenomenon without gangrene 09/01/2019   Bradycardia 12/22/2018   Atypical chest pain  12/22/2018   Essential hypertension 12/22/2018   Urinary frequency 12/17/2011   Night terrors 12/17/2011   ADD (attention deficit disorder)    Seasonal allergies    Recurrent URI (upper respiratory infection)    Past Medical History:  Diagnosis Date   ADD (attention deficit disorder)    Angio-edema    Chronic sinus infection    GERD (gastroesophageal reflux disease)    Hypertension    Iron deficiency    Irritable bowel syndrome    Personal history of urinary  (tract) infection    Raynaud's disease    Seasonal allergies    Urinary frequency 05/15/11   Past Surgical History: Past Surgical History:  Procedure Laterality Date   COLONOSCOPY     WISDOM TOOTH EXTRACTION     Medication List:  Current Outpatient Medications  Medication Sig Dispense Refill   amphetamine-dextroamphetamine (ADDERALL) 10 MG tablet Take 10 mg by mouth daily with breakfast.     valACYclovir (VALTREX) 500 MG tablet Take by mouth as needed.     dicyclomine (BENTYL) 10 MG capsule Take 1 capsule (10 mg total) by mouth 3 (three) times daily before meals. (Patient not taking: No sig reported) 90 capsule 11   ferrous sulfate 324 MG TBEC Take 324 mg by mouth daily. (Patient not taking: No sig reported)     No current facility-administered medications for this visit.   Allergies: Allergies  Allergen Reactions   Ciprofloxacin Hcl Nausea Only   Nitrofurantoin Other (See Comments)    Other reaction(s): Vomiting Other reaction(s): Vomiting   Other Other (See Comments), Nausea Only and Nausea And Vomiting   Social History: Social History   Socioeconomic History   Marital status: Single    Spouse name: Not on file   Number of children: Not on file   Years of education: Not on file   Highest education level: Not on file  Occupational History   Not on file  Tobacco Use   Smoking status: Never Smoker   Smokeless tobacco: Never Used  Vaping Use   Vaping Use: Never used  Substance and Sexual Activity   Alcohol use: Yes    Alcohol/week: 4.0 standard drinks    Types: 4 Cans of beer per week    Comment: some weeks   Drug use: No   Sexual activity: Not on file  Other Topics Concern   Not on file  Social History Narrative   Not on file   Social Determinants of Health   Financial Resource Strain: Not on file  Food Insecurity: Not on file  Transportation Needs: Not on file  Physical Activity: Not on file  Stress: Not on file  Social  Connections: Not on file   Lives in a duplex. Smoking: denies Occupation: Electronics engineer HistorySurveyor, minerals in the house: no Engineer, civil (consulting) in the family room: no Carpet in the bedroom: yes Heating: electric Cooling: central Pet: yes 2 dogs x 7 yrs, 3 yrs  Family History: Family History  Problem Relation Age of Onset   Hypertension Mother    Luiz Blare' disease Mother    Crohn's disease Mother    Irritable bowel syndrome Mother    Other Father        trigeminal neuralgia   Asthma Sister    Heart disease Maternal Grandmother    Irritable bowel syndrome Maternal Grandmother    Heart disease Maternal Grandfather    Irritable bowel syndrome Maternal Grandfather    Review of Systems  Constitutional: Negative for appetite  change, chills, fever and unexpected weight change.  HENT: Negative for congestion and rhinorrhea.   Eyes: Negative for itching.  Respiratory: Negative for cough, chest tightness, shortness of breath and wheezing.   Cardiovascular: Negative for chest pain.  Gastrointestinal: Positive for abdominal distention, abdominal pain, constipation and diarrhea.  Genitourinary: Negative for difficulty urinating.  Skin: Positive for rash.  Allergic/Immunologic: Positive for environmental allergies.  Neurological: Negative for headaches.   Objective: BP 110/68    Pulse (!) 49 Comment: Patient is being monitored by cardiologist   Temp (!) 97.1 F (36.2 C) (Temporal)    Resp 16    Ht 5\' 7"  (1.702 m)    Wt 155 lb 4 oz (70.4 kg)    SpO2 98%    BMI 24.32 kg/m  Body mass index is 24.32 kg/m. Physical Exam Vitals and nursing note reviewed.  Constitutional:      Appearance: Normal appearance. She is well-developed.  HENT:     Head: Normocephalic and atraumatic.     Right Ear: Tympanic membrane and external ear normal.     Left Ear: Tympanic membrane and external ear normal.     Nose: Nose normal.     Mouth/Throat:     Mouth: Mucous  membranes are moist.     Pharynx: Oropharynx is clear.  Eyes:     Conjunctiva/sclera: Conjunctivae normal.  Cardiovascular:     Rate and Rhythm: Normal rate and regular rhythm.     Heart sounds: Normal heart sounds. No murmur heard. No friction rub. No gallop.   Pulmonary:     Effort: Pulmonary effort is normal.     Breath sounds: Normal breath sounds. No wheezing, rhonchi or rales.  Musculoskeletal:     Cervical back: Neck supple.  Skin:    General: Skin is warm.     Findings: No rash.  Neurological:     Mental Status: She is alert and oriented to person, place, and time.  Psychiatric:        Behavior: Behavior normal.    The plan was reviewed with the patient/family, and all questions/concerned were addressed.  It was my pleasure to see Kimberly Larson today and participate in her care. Please feel free to contact me with any questions or concerns.  Sincerely,  Wyline MoodYoon Delina Kruczek, DO Allergy & Immunology  [facility] [place] office: [phone] [facility] office: [phone]

## 2020-07-26 ENCOUNTER — Other Ambulatory Visit: Payer: Self-pay

## 2020-07-26 ENCOUNTER — Encounter: Payer: Self-pay | Admitting: Allergy

## 2020-07-26 ENCOUNTER — Ambulatory Visit: Payer: BC Managed Care – PPO | Admitting: Allergy

## 2020-07-26 VITALS — BP 110/68 | HR 49 | Temp 97.1°F | Resp 16 | Ht 67.0 in | Wt 155.2 lb

## 2020-07-26 DIAGNOSIS — R140 Abdominal distension (gaseous): Secondary | ICD-10-CM | POA: Diagnosis not present

## 2020-07-26 DIAGNOSIS — T781XXD Other adverse food reactions, not elsewhere classified, subsequent encounter: Secondary | ICD-10-CM | POA: Diagnosis not present

## 2020-07-26 DIAGNOSIS — R21 Rash and other nonspecific skin eruption: Secondary | ICD-10-CM

## 2020-07-26 DIAGNOSIS — R220 Localized swelling, mass and lump, head: Secondary | ICD-10-CM | POA: Diagnosis not present

## 2020-07-26 DIAGNOSIS — J3089 Other allergic rhinitis: Secondary | ICD-10-CM

## 2020-07-26 NOTE — Assessment & Plan Note (Signed)
.   See assessment and plan as above. 

## 2020-07-26 NOTE — Patient Instructions (Addendum)
Today's skin testing showed: Positive to grass pollen, tree pollen, cat dander. Negative to foods. Results given.   Abdominal issues:  Continue to follow up with GI and their recommendations. Skin prick testing and bloodwork (food IgE levels) check for IgE mediated reactions which her clinical presentation does not support.  Keep a food journal with symptoms and foods eaten. Avoid foods that are bothersome - dairy. Continue with the FODMAP diet.   Environmental allergies  Start environmental control measures as below.  May use over the counter antihistamines such as Zyrtec (cetirizine), Claritin (loratadine), Allegra (fexofenadine), or Xyzal (levocetirizine) daily as needed.  May use Flonase (fluticasone) nasal spray 1 spray per nostril twice a day as needed for nasal congestion.   Rash/swelling:  Keep track of episodes and take pictures.  See below for proper skin care.  Get bloodwork:  We are ordering labs, so please allow 1-2 weeks for the results to come back. With the newly implemented Cures Act, the labs might be visible to you at the same time that they become visible to me. However, I will not address the results until all of the results are back, so please be patient.   Follow up in 4 months or sooner if needed.   Skin care recommendations  Bath time: . Always use lukewarm water. AVOID very hot or cold water. Marland Kitchen Keep bathing time to 5-10 minutes. . Do NOT use bubble bath. . Use a mild soap and use just enough to wash the dirty areas. . Do NOT scrub skin vigorously.  . After bathing, pat dry your skin with a towel. Do NOT rub or scrub the skin.  Moisturizers and prescriptions:  . ALWAYS apply moisturizers immediately after bathing (within 3 minutes). This helps to lock-in moisture. . Use the moisturizer several times a day over the whole body. Peri Jefferson summer moisturizers include: Aveeno, CeraVe, Cetaphil. Peri Jefferson winter moisturizers include: Aquaphor, Vaseline,  Cerave, Cetaphil, Eucerin, Vanicream. . When using moisturizers along with medications, the moisturizer should be applied about one hour after applying the medication to prevent diluting effect of the medication or moisturize around where you applied the medications. When not using medications, the moisturizer can be continued twice daily as maintenance.  Laundry and clothing: . Avoid laundry products with added color or perfumes. . Use unscented hypo-allergenic laundry products such as Tide free, Cheer free & gentle, and All free and clear.  . If the skin still seems dry or sensitive, you can try double-rinsing the clothes. . Avoid tight or scratchy clothing such as wool. . Do not use fabric softeners or dyer sheets.  Reducing Pollen Exposure . Pollen seasons: trees (spring), grass (summer) and ragweed/weeds (fall). Marland Kitchen Keep windows closed in your home and car to lower pollen exposure.  Lilian Kapur air conditioning in the bedroom and throughout the house if possible.  . Avoid going out in dry windy days - especially early morning. . Pollen counts are highest between 5 - 10 AM and on dry, hot and windy days.  . Save outside activities for late afternoon or after a heavy rain, when pollen levels are lower.  . Avoid mowing of grass if you have grass pollen allergy. Marland Kitchen Be aware that pollen can also be transported indoors on people and pets.  . Dry your clothes in an automatic dryer rather than hanging them outside where they might collect pollen.  . Rinse hair and eyes before bedtime. Pet Allergen Avoidance: . Contrary to popular opinion, there are  no "hypoallergenic" breeds of dogs or cats. That is because people are not allergic to an animal's hair, but to an allergen found in the animal's saliva, dander (dead skin flakes) or urine. Pet allergy symptoms typically occur within minutes. For some people, symptoms can build up and become most severe 8 to 12 hours after contact with the animal. People  with severe allergies can experience reactions in public places if dander has been transported on the pet owners' clothing. Marland Kitchen Keeping an animal outdoors is only a partial solution, since homes with pets in the yard still have higher concentrations of animal allergens. . Before getting a pet, ask your allergist to determine if you are allergic to animals. If your pet is already considered part of your family, try to minimize contact and keep the pet out of the bedroom and other rooms where you spend a great deal of time. . As with dust mites, vacuum carpets often or replace carpet with a hardwood floor, tile or linoleum. . High-efficiency particulate air (HEPA) cleaners can reduce allergen levels over time. . While dander and saliva are the source of cat and dog allergens, urine is the source of allergens from rabbits, hamsters, mice and [place] pigs; so ask a non-allergic family member to clean the animal's cage. . If you have a pet allergy, talk to your allergist about the potential for allergy immunotherapy (allergy shots). This strategy can often provide long-term relief.

## 2020-07-26 NOTE — Assessment & Plan Note (Addendum)
Patient follows with GI for abdominal bloating/distension, diarrhea and constipation. Had EGD/colonoscopy - polyps, gastritis, GERD, hiatal hernia, ? IBS. No specific food triggers noted. Tired Bentyl and fodmap diet with no significant improvement. Concerned about food allergies.  Discussed with patient length that skin prick testing and bloodwork (food IgE levels) check for IgE mediated reactions which her clinical presentation does not support.   Patient would still like to rule it out.   Today's skin testing showed: Negative to food panel. Results given.   Continue to follow up with GI and their recommendations. Keep a food journal with symptoms and foods eaten. Avoid foods that are bothersome - dairy. Continue with the FODMAP diet.

## 2020-07-26 NOTE — Assessment & Plan Note (Signed)
Rhinoconjunctivitis symptoms in the spring and uses Claritin and Flonase with good benefit.  No prior allergy evaluation.  Today's skin testing showed: Positive to grass pollen, tree pollen, cat dander.  Start environmental control measures as below.  May use over the counter antihistamines such as Zyrtec (cetirizine), Claritin (loratadine), Allegra (fexofenadine), or Xyzal (levocetirizine) daily as needed.  May use Flonase (fluticasone) nasal spray 1 spray per nostril twice a day as needed for nasal congestion.

## 2020-07-26 NOTE — Assessment & Plan Note (Signed)
Patient endorses facial swelling, non-pruritic blotchy rash on the neck at times. No triggers noted. No respiratory compromise. Concerned abut allergic triggers.  Today's skin testing showed: Positive to grass pollen, tree pollen, cat dander. Negative to foods.  Keep track of episodes and take pictures - not sure what's causing this.   See below for proper skin care.  Get bloodwork to rule out other etiologies.

## 2020-09-27 IMAGING — CT CT CHEST W/ CM
2 of 4 series · 15 of 36 positions shown, 18 images · IV contrast (omnipaque)
Comparison: Chest radiograph 09/08/2019

CLINICAL DATA: Pulmonary infiltrate on chest radiograph, shortness
of breath

EXAM:
CT CHEST WITH CONTRAST
TECHNIQUE: Multidetector CT imaging of the chest was performed during
intravenous contrast administration. Sagittal and coronal MPR images
reconstructed from axial data set.
CONTRAST:  100mL OMNIPAQUE IOHEXOL 300 MG/ML  SOLN IV

[Series 3: lungs · axial · 0.78mm/px · z∈[-286,-2]mm · 12 of 160 slices shown, 15 images]
[im 9/160  mediastinal]
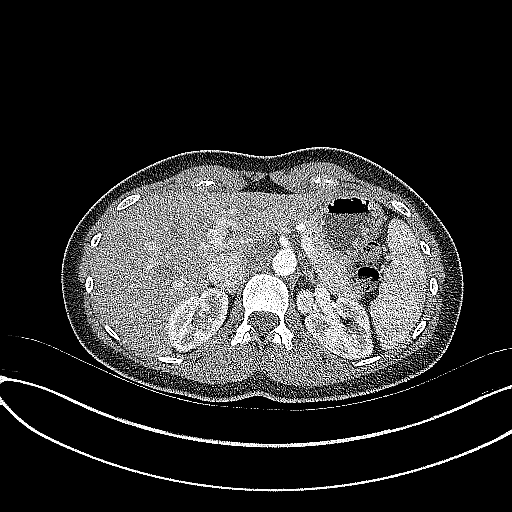
[im 9/160  lung]
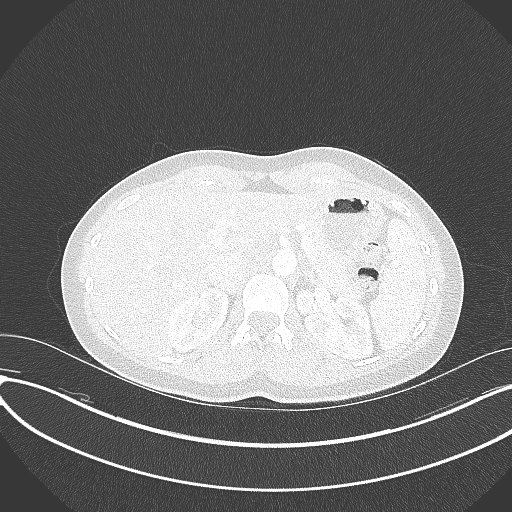
[im 26/160  lung]
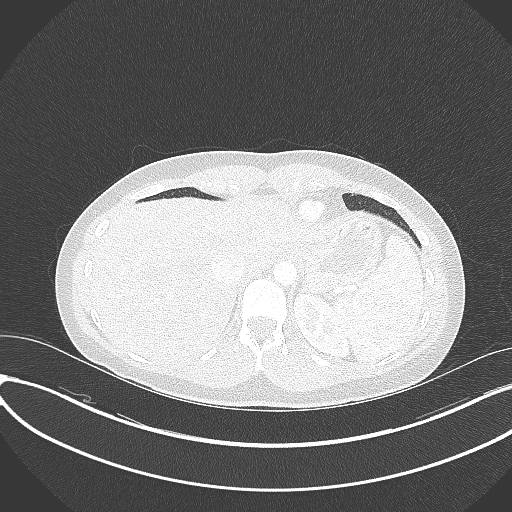
[im 34/160  lung]
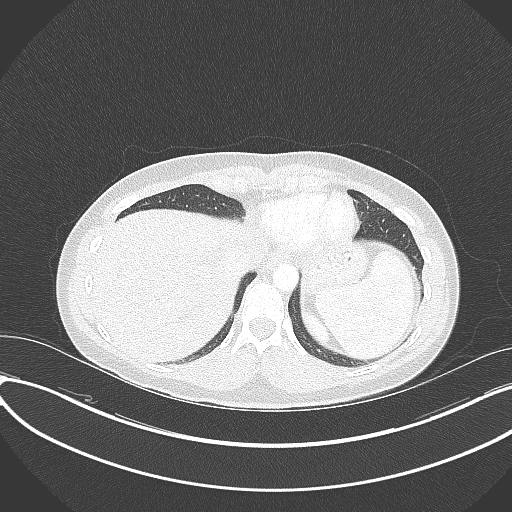
[im 51/160  lung]
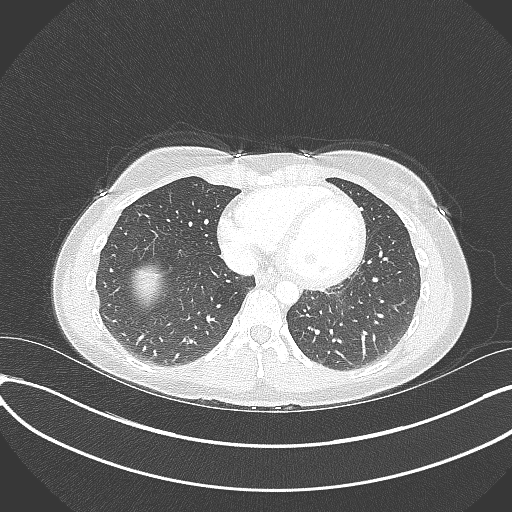
[im 59/160  mediastinal]
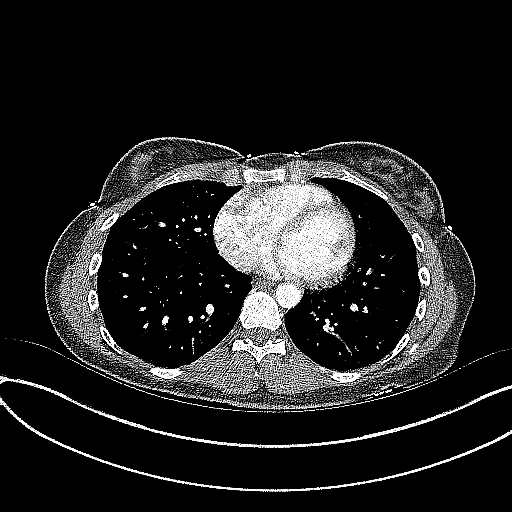
[im 59/160  lung]
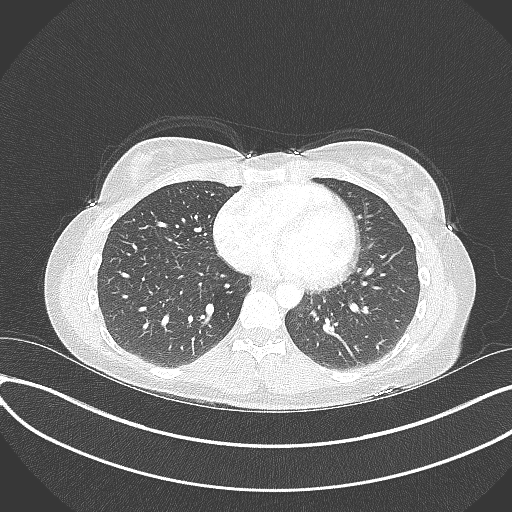
[im 76/160  lung]
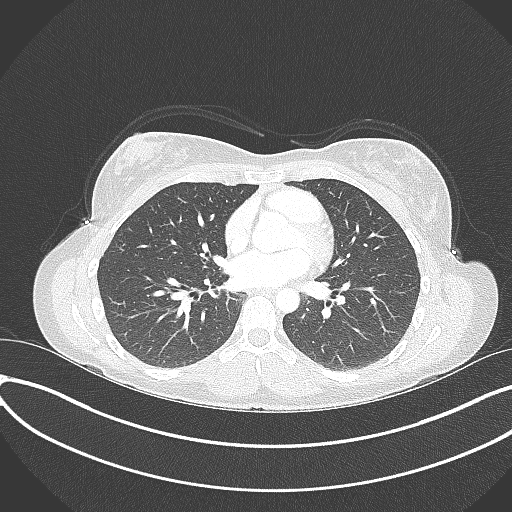
[im 84/160  lung]
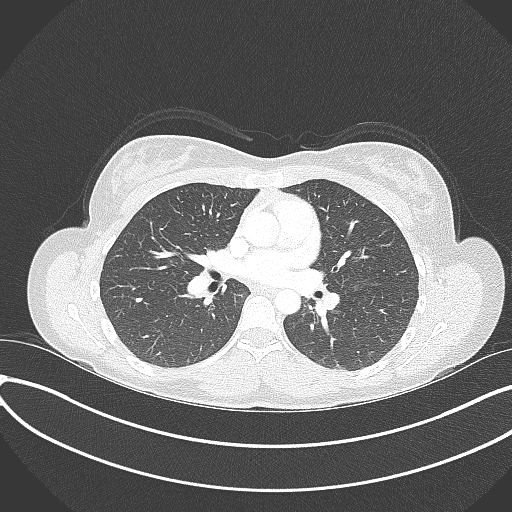
[im 101/160  lung]
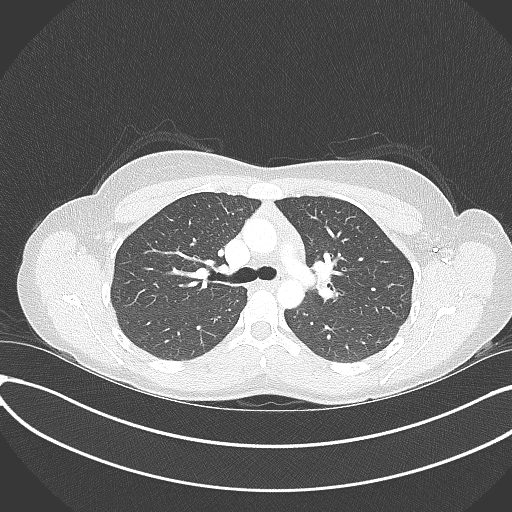
[im 109/160  mediastinal]
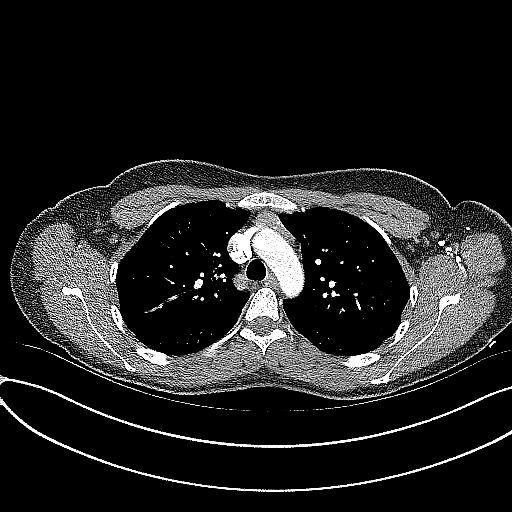
[im 109/160  lung]
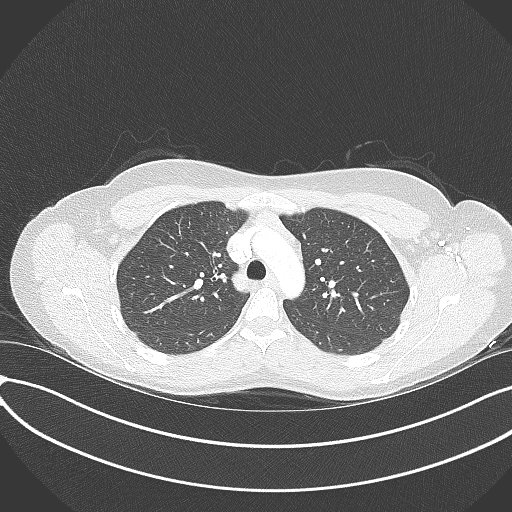
[im 126/160  lung]
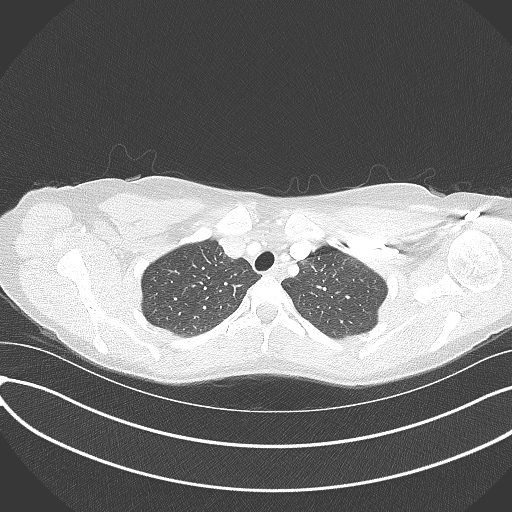
[im 134/160  lung]
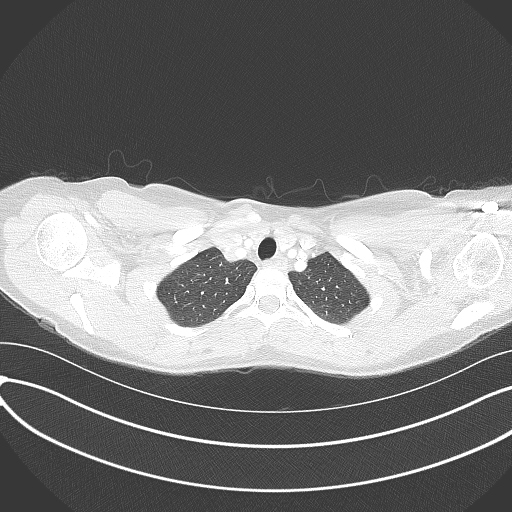
[im 151/160  lung]
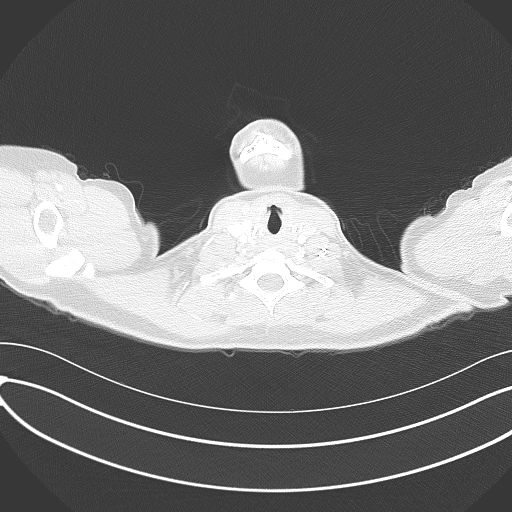

[Series 5: coronal · coronal · 0.63mm/px · 3 of 126 slices shown]
[im 26/126  lung]
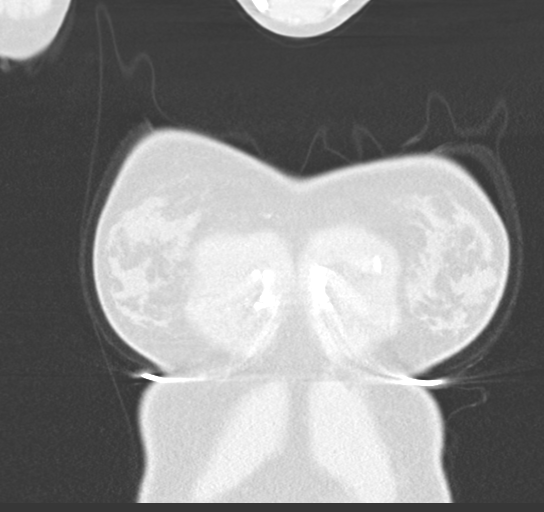
[im 51/126  lung]
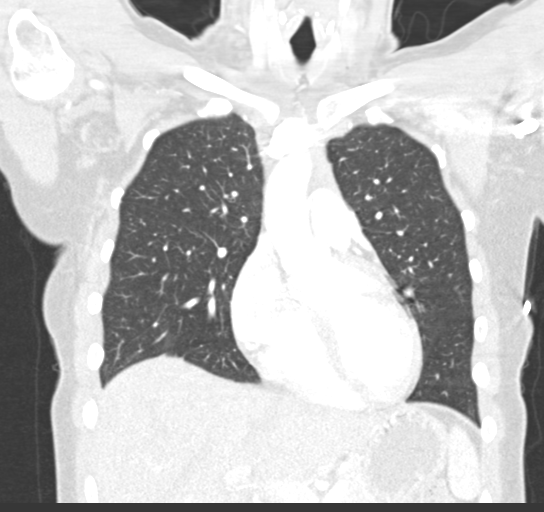
[im 76/126  lung]
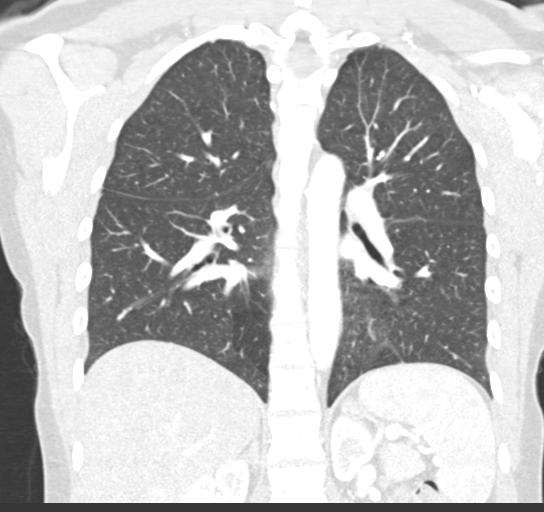

[15 of 36 positions shown; findings below may reference images not displayed]

FINDINGS: Cardiovascular: Thoracic vascular structures patent on non targeted
exam. Heart normal size. No pericardial effusion.

Mediastinum/Nodes: Esophagus unremarkable. Base of cervical region
normal appearance. No thoracic adenopathy.

Lungs/Pleura: Lungs clear. Nonspecific 4 mm RIGHT lower lobe nodule
image 98. Remaining lungs clear. No pulmonary infiltrate, pleural
effusion or pneumothorax.

Upper Abdomen: Calcified granulomata superior liver. Remaining
visualized upper abdomen unremarkable

Musculoskeletal: Osseous structures unremarkable.
IMPRESSION: No pulmonary infiltrates, mass or adenopathy identified.

Nonspecific 4 mm RIGHT lower lobe nodule, recommendation below.

No follow-up needed if patient is low-risk. Non-contrast chest CT
can be considered in 12 months if patient is high-risk. This
recommendation follows the consensus statement: Guidelines for
Management of Incidental Pulmonary Nodules Detected on CT Images:

## 2021-05-29 NOTE — Progress Notes (Signed)
Follow-up Outpatient Visit Date: 05/30/2021  Primary Care Provider: Reynold Bowen, [place] [address]  Chief Complaint: Follow-up chest pain and bradycardia  HPI:  Kimberly Larson is a 31 y.o. female with history of hypertension, ADD, recurrent URIs and strep infections, irritable bowel syndrome, and seasonal allergies, who presents for follow-up of chest pain and bradycardia.  We last spoke via virtual visit in 08/2019, at which time she continued to complain of brief episodes of chest pain occurred about every other day.  She noted continued low heart rate alerts by her heart rate tracker when asleep or relaxing (asymptomatic HR down to 35 bpm).  We agreed to add amlodipine 2.5 mg daily for treatment of Raynaud's phenomenon as well as possible coronary vasospasm leading to her chest pain.  She stopped the medication after 1 month, as it did not help her symptoms but led to leg swelling.  Today, Kimberly Larson reports that she has been feeling fairly well.  She still notices that her heart rate sometimes dips into the 40s when resting/sleeping on her smart watch.  She has also detected some low oxygen saturations (into the 80s) when asleep.  She has not been told by her husband that she snores or stops breathing.  She has never been evaluated for sleep apnea.  She exercises regularly without symptoms and notes that her heart rate increases appropriately.  Kimberly Larson continues to have paresthesias and numbness in her extremities.  At times, it affects her hands or feet.  Occasionally, it will involve the whole arm or even an entire half of her body.  This is not related to certain activities or positions.  She recently saw her new PCP, Dr. Forde Dandy, who performed extensive lab testing.  She notes occasional chest pain, usually at rest, which feels like her "chest is collapsing."  She does not have any exertional chest pain.  She denies palpitations.  She sometimes gets lightheaded when she has a  migraine headache.  She is trying to become pregnant and wants to make sure that her heart is "in good shape."  --------------------------------------------------------------------------------------------------  Cardiovascular History & Procedures: Cardiovascular Problems: Atypical chest pain Hypertension Ectopic atrial rhythm   Risk Factors: Hypertension   Cath/PCI: None   CV Surgery: None   EP Procedures and Devices: 14-day event monitor (01/11/2019): Predominantly sinus rhythm with rare PACs and PVCs as well as episodes of ectopic atrial rhythm and accelerated idioventricular rhythm.   Non-Invasive Evaluation(s): Renal artery Doppler (01/12/2019): Normal study without evidence of renal artery stenosis. TTE (01/12/2019): Normal LV size and wall thickness.  LVEF 55-60% with normal diastolic function.  Normal RV size and function.  No significant valvular abnormality.  Recent CV Pertinent Labs: Lab Results  Component Value Date   K 4.0 12/15/2019   BUN 14 12/15/2019   CREATININE 0.95 12/15/2019    Past medical and surgical history were reviewed and updated in EPIC.  Current Meds  Medication Sig   amphetamine-dextroamphetamine (ADDERALL) 10 MG tablet Take 10 mg by mouth daily as needed.   ferrous sulfate 324 (65 Fe) MG TBEC Take 1 tablet by mouth daily as needed.   tretinoin (RETIN-A) 0.05 % cream Apply 1 application topically daily as needed.   valACYclovir (VALTREX) 500 MG tablet Take by mouth as needed.    Allergies: Ciprofloxacin hcl, Nitrofurantoin, and Other  Social History   Tobacco Use   Smoking status: Never   Smokeless tobacco: Never  Vaping Use   Vaping Use:  Never used  Substance Use Topics   Alcohol use: Yes    Alcohol/week: 4.0 standard drinks    Types: 4 Cans of beer per week    Comment: some weeks   Drug use: No    Family History  Problem Relation Age of Onset   Hypertension Mother    Kimberly Larson' disease Mother    Crohn's disease Mother     Irritable bowel syndrome Mother    Other Father        trigeminal neuralgia   Asthma Sister    Heart disease Maternal Grandmother    Irritable bowel syndrome Maternal Grandmother    Heart disease Maternal Grandfather    Irritable bowel syndrome Maternal Grandfather     Review of Systems: A 12-system review of systems was performed and was negative except as noted in the HPI.  --------------------------------------------------------------------------------------------------  Physical Exam: BP 120/80 (BP Location: Left Arm, Patient Position: Sitting, Cuff Size: Normal)    Pulse (!) 51    Ht 5\' 7"  (1.702 m)    Wt 169 lb (76.7 kg)    SpO2 99%    BMI 26.47 kg/m   General:  NAD. Neck: No JVD or HJR. Lungs: Clear to auscultation bilaterally without wheezes or crackles. Heart: Bradycardic but regular without murmurs, rubs, or gallops. Abdomen: Soft, nontender, nondistended. Extremities: No lower extremity edema.  EKG: Normal sinus rhythm with RSR prime in V1.  No significant change since 09/08/2019.  Lab Results  Component Value Date   WBC 6.6 12/15/2019   HGB 14.6 12/15/2019   HCT 42.1 12/15/2019   MCV 88.9 12/15/2019   PLT 230.0 12/15/2019    Lab Results  Component Value Date   NA 138 12/15/2019   K 4.0 12/15/2019   CL 104 12/15/2019   CO2 26 12/15/2019   BUN 14 12/15/2019   CREATININE 0.95 12/15/2019   GLUCOSE 87 12/15/2019   ALT 9 12/15/2019    --------------------------------------------------------------------------------------------------  ASSESSMENT AND PLAN: Chest pain: Chest pain remains atypical.  Prior echo was normal.  EKG today does not show any ischemic changes.  I think it is reasonable to defer additional testing at this time.  Sinus bradycardia: Chronic and stable.  Prior event monitor showed normal chronotropic response.  Accelerated junctional rhythm was noted at times.  We will reach out to Dr. Renard Hamper office for copy of recent labs, particularly  thyroid function tests.  Otherwise, no further work-up recommended.  Paresthesias: Numbness is migratory and unrelated to specific activity.  I do not believe that this is cardiovascular in nature.  Prior trial of amlodipine for possible Raynaud's phenomenon did not provide any improvement.  If symptoms persist, Ms. Nix should speak with her PCP about neurology consultation.  Hypertension: Blood pressure upper normal today off pharmacotherapy.  Prior renal artery Doppler without evidence of renal artery stenosis.  No further work-up recommended at this time.  If she does become pregnant, close monitoring of blood pressure will be necessary.  Nocturnal hypoxia: Ms. Huckeba notes some low oxygen saturation readings recorded by her apple watch at home.  She is also concerned about an incidentally noted 4 mm lung nodule on chest CT in 2021.  We will refer her to pulmonary in [facility] for further evaluation.  Prenatal planning: I encouraged Ms. Truman Hayward to speak with her OB/GYN and dermatologist about safety of tretinoin cream use the setting of pregnancy.  She should continue prenatal vitamin use.  Follow-up: Return to clinic in 1 year.  [name], MD 05/30/2021  9:29 AM

## 2021-05-30 ENCOUNTER — Ambulatory Visit: Payer: BC Managed Care – PPO | Admitting: Internal Medicine

## 2021-05-30 ENCOUNTER — Encounter: Payer: Self-pay | Admitting: Internal Medicine

## 2021-05-30 ENCOUNTER — Other Ambulatory Visit: Payer: Self-pay

## 2021-05-30 VITALS — BP 120/80 | HR 51 | Ht 67.0 in | Wt 169.0 lb

## 2021-05-30 DIAGNOSIS — I10 Essential (primary) hypertension: Secondary | ICD-10-CM

## 2021-05-30 DIAGNOSIS — R0789 Other chest pain: Secondary | ICD-10-CM

## 2021-05-30 DIAGNOSIS — R001 Bradycardia, unspecified: Secondary | ICD-10-CM | POA: Diagnosis not present

## 2021-05-30 DIAGNOSIS — G4734 Idiopathic sleep related nonobstructive alveolar hypoventilation: Secondary | ICD-10-CM

## 2021-05-30 NOTE — Patient Instructions (Addendum)
Medication Instructions:  - Your physician recommends that you continue on your current medications as directed. Please refer to the Current Medication list given to you today.  - Please talk to your OB/GYN & Dermatologist about Tretinion (Retin-A) is safe for you to take in regards to potential pregnancy  *If you need a refill on your cardiac medications before your next appointment, please call your pharmacy*   Lab Work: - none ordered  If you have labs (blood work) drawn today and your tests are completely normal, you will receive your results only by: MyChart Message (if you have MyChart) OR A paper copy in the mail If you have any lab test that is abnormal or we need to change your treatment, we will call you to review the results.   Testing/Procedures: - You have been referred to : [facility] [place] [facility] (Nocturnal Hypoxia)  The Pulmonology office should reach out to you directly to schedule an appointment. If you do not hear from them in the next 2 weeks, please call our office at [phone] so we may follow up on this for you.    Follow-Up: At [facility], you and your health needs are our priority.  As part of our continuing mission to provide you with exceptional heart care, we have created designated Provider Care Teams.  These Care Teams include your primary Cardiologist (physician) and Advanced Practice Providers (APPs -  Physician Assistants and Nurse Practitioners) who all work together to provide you with the care you need, when you need it.  We recommend signing up for the patient portal called "MyChart".  Sign up information is provided on this After Visit Summary.  MyChart is used to connect with patients for Virtual Visits (Telemedicine).  Patients are able to view lab/test results, encounter notes, upcoming appointments, etc.  Non-urgent messages can be sent to your provider as well.   To learn more about what you can do with MyChart, go to  [email or web address].    Your next appointment:   1 year(s)  The format for your next appointment:   In Person  Provider:   You may see Yvonne Kendall, MD or one of the following Advanced Practice Providers on your designated Care Team:   Nicolasa Ducking, N Ryan Dunn, PA-C Cadence Fransico Michael, [place]    Other Instructions N/a

## 2021-07-18 ENCOUNTER — Ambulatory Visit: Payer: BC Managed Care – PPO | Admitting: Gastroenterology

## 2021-07-18 ENCOUNTER — Encounter: Payer: Self-pay | Admitting: Gastroenterology

## 2021-07-18 VITALS — BP 130/76 | HR 60 | Ht 67.0 in | Wt 165.2 lb

## 2021-07-18 DIAGNOSIS — K219 Gastro-esophageal reflux disease without esophagitis: Secondary | ICD-10-CM | POA: Diagnosis not present

## 2021-07-18 DIAGNOSIS — K581 Irritable bowel syndrome with constipation: Secondary | ICD-10-CM | POA: Diagnosis not present

## 2021-07-18 MED ORDER — GLYCOPYRROLATE 1 MG PO TABS: 1.0000 mg | ORAL_TABLET | Freq: Two times a day (BID) | ORAL | 5 refills | Status: DC

## 2021-07-18 MED ORDER — OMEPRAZOLE 20 MG PO CPDR: 20.0000 mg | DELAYED_RELEASE_CAPSULE | Freq: Every day | ORAL | 11 refills | Status: DC

## 2021-07-18 NOTE — Progress Notes (Signed)
? ? ?  History of Present Illness: This is a 31 year old female complaining of abdominal pain, abdominal bloating, constipation.  She notes that daily MiraLAX is not adequately controlling her constipation.  She often has 2 to 4 days in between bowel movements this is frequently associated with abdominal bloating and mild abdominal pain.  She also notes occasional reflux symptoms with regurgitation.  Her weight has increased from 148 lbs in August 2021 to 165 lbs today.  She states she was recently evaluated by Dr. Evlyn Kanner and blood work including thyroid testing was normal. ? ?Colonoscopy 02/2020 ?Normal to TI random biopsies normal  ? ?EGD 02/2020 ?Gastric erythema gastric and duodenal biopsies were normal  ? ?Current Medications, Allergies, Past Medical History, Past Surgical History, Family History and Social History were reviewed in [employer] record. ? ? ?Physical Exam: ?General: Well developed, well nourished, no acute distress ?Head: Normocephalic and atraumatic ?Eyes: Sclerae anicteric, EOMI ?Ears: Normal auditory acuity ?Mouth: Not examined, mask on during Covid-19 pandemic ?Lungs: Clear throughout to auscultation ?Heart: Regular rate and rhythm; no murmurs, rubs or bruits ?Abdomen: Soft, non tender and non distended. No masses, hepatosplenomegaly or hernias noted. Normal Bowel sounds ?Rectal: Not done ?Musculoskeletal: Symmetrical with no gross deformities  ?Pulses:  Normal pulses noted ?Extremities: No clubbing, cyanosis, edema or deformities noted ?Neurological: Alert oriented x 4, grossly nonfocal ?Psychological:  Alert and cooperative. Normal mood and affect ? ? ?Assessment and Recommendations: ? ?IBS-C.  Increase MiraLAX to 2-3 times daily titrated for at least one complete bowel movement daily.  Begin glycopyrrolate 1 mg p.o. twice daily as needed abdominal pain and abdominal bloating.  Contact us if symptoms are not under good control within the next month.  Weight gain may be  contributing to her abdominal bloating symptoms. ?GERD.  Follow antireflux measures.  Begin omeprazole 20 mg p.o. daily.  Contact us if symptoms are not under good control within the next month. ?

## 2021-07-18 NOTE — Patient Instructions (Signed)
Increase your Miralax to 2-3 x daily titrate to have at least 1 bowel movement daily.  ? ?We have sent the following medications to your pharmacy for you to pick up at your convenience: omeprazole 20 mg daily x 1 month then as needed and robinul. If your constipation is under control, you can stop the robinul.  ? ?Call our office in 3 weeks if your symptoms are not under control.  ? ?The [facility] providers would like to encourage you to use [facility] to communicate with providers for non-urgent requests or questions.  Due to long hold times on the telephone, sending your provider a message by [facility] may be a faster and more efficient way to get a response.  Please allow 48 business hours for a response.  Please remember that this is for non-urgent requests.  ? ?Thank you for choosing me and [facility]. ? ?Malcolm T. Pleas Koch., MD., Clementeen Graham ? ? ? ? ?

## 2021-07-29 NOTE — Progress Notes (Signed)
07/30/21- 30 yoF never smoker for sleep evaluation courtesy of Christopher End, MD(cardiology) with concern of nocturnal hypoxia ?Medical problem list includes HTN, Raynaud's Disease, Chronic Sinus Infection, Allergic Rhinitis, GERD, IBS, Rash/ Angioedema, Night Terrors,  ?-Adderall 10,  ?Epworth score-8 ?Body weight today-166 lbs      Arrival room air O2 sat today 99% ?Covid vax-2 Phizer ?Flu vax-no ?-----Patient states that her oxygen drops low when she is sleeping. Her cardiologist told her to get it checked out. Patient states she has a low heart rate she has been getting checked. She is very tired when she wakes up, has been waking up in "pools" of drool that has got worse and told that she snores. Apple watch reports O2 dropping in sleep. ?Her watch also indicated bradycardia down into the 40s during sleep which led her to cardiology evaluation.  No history of ENT surgery. ?Uncle and grandfather positive for OSA.  CT scan had shown incidental small lung nodule otherwise no lung disease. ?She stays "tired" and takes Adderall, 1 cup of coffee and 1 Coke Zero daily no nap opportunities. 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  Not pregnant. ?CT chest 10/14/19- ?IMPRESSION: ?No pulmonary infiltrates, mass or adenopathy identified. ?Nonspecific 4 mm RIGHT lower lobe nodule, recommendation below. ?No follow-up needed if patient is low-risk. Non-contrast chest CT ?can be considered in 12 months if patient is high-risk. This ?recommendation follows the consensus statement: Guidelines for ?Management of Incidental Pulmonary Nodules Detected on CT Images: ?From the Fleischner Society 2017; Radiology 2017; 284:228-243. ? ?Prior to Admission medications   ?Medication Sig Start Date End Date Taking? Authorizing Provider  ?amphetamine-dextroamphetamine (ADDERALL) 10 MG tablet Take 10 mg by mouth daily as needed.   Yes [provider]   ?glycopyrrolate (ROBINUL) 1 MG tablet Take 1 tablet (1 mg total) by mouth 2 (two) times daily. 07/18/21  Yes Ladene Artist, MD  ?tretinoin (RETIN-A) 0.05 % cream Apply 1 application topically daily as needed.   Yes [provider]  ?valACYclovir (VALTREX) 500 MG tablet Take by mouth as needed.   Yes [provider]  ? ?Past Medical History:  ?Diagnosis Date  ? ADD (attention deficit disorder)   ? Angio-edema   ? Chronic sinus infection   ? GERD (gastroesophageal reflux disease)   ? Hypertension   ? Iron deficiency   ? Irritable bowel syndrome   ? Personal history of urinary (tract) infection   ? Raynaud's disease   ? Seasonal allergies   ? Urinary frequency 05/15/11  ? ?Past Surgical History:  ?Procedure Laterality Date  ? COLONOSCOPY    ? WISDOM TOOTH EXTRACTION    ? ?Family History  ?Problem Relation Age of Onset  ? Hypertension Mother   ? Graves' disease Mother   ? Crohn's disease Mother   ? Irritable bowel syndrome Mother   ? Other Father   ?     trigeminal neuralgia  ? Asthma Sister   ? Heart disease Maternal Grandmother   ? Irritable bowel syndrome Maternal Grandmother   ? Heart disease Maternal Grandfather   ? Irritable bowel syndrome Maternal Grandfather   ? ?Social History  ? ?Socioeconomic History  ? Marital status: Single  ?  Spouse name: Not on file  ? Number of children: Not on file  ? Years of education: Not on file  ? Highest education level: Not on file  ?Occupational History  ? Not on file  ?  Tobacco Use  ? Smoking status: Never  ? Smokeless tobacco: Never  ?Vaping Use  ? Vaping Use: Never used  ?Substance and Sexual Activity  ? Alcohol use: Yes  ?  Alcohol/week: 4.0 standard drinks  ?  Types: 4 Cans of beer per week  ?  Comment: some weeks  ? Drug use: No  ? Sexual activity: Not on file  ?Other Topics Concern  ? Not on file  ?Social History Narrative  ? Not on file  ? ?Social Determinants of Health  ? ?Financial Resource Strain: Not on file  ?Food Insecurity: Not on file   ?Transportation Needs: Not on file  ?Physical Activity: Not on file  ?Stress: Not on file  ?Social Connections: Not on file  ?Intimate Partner Violence: Not on file  ? ?ROS-see HPI   + = positive ?Constitutional:    weight loss, night sweats, fevers, chills, +fatigue, lassitude. ?HEENT:    headaches, difficulty swallowing, tooth/dental problems, sore throat,  ?     sneezing, itching, ear ache, nasal congestion, post nasal drip, snoring ?CV:    chest pain, orthopnea, PND, swelling in lower extremities, anasarca,                     dizziness, palpitations ?Resp:   shortness of breath with exertion or at rest.   ?             productive cough,   non-productive cough, coughing up of blood.   ?           change in color of mucus.  wheezing.   ?Skin:    rash or lesions. ?GI:  No-   heartburn, indigestion, abdominal pain, nausea, vomiting, diarrhea,  ?               change in bowel habits, loss of appetite ?GU: dysuria, change in color of urine, no urgency or frequency.   flank pain. ?MS:   joint pain, stiffness, decreased range of motion, back pain. ?Neuro-     nothing unusual ?Psych:  change in mood or affect.  depression or anxiety.   memory loss. ? ?OBJ- Physical Exam ?General- Alert, Oriented, Affect-appropriate, Distress- none acute, +not obese ?Skin- rash-none, lesions- none, excoriation- none ?Lymphadenopathy- none ?Head- atraumatic ?           Eyes- Gross vision intact, PERRLA, conjunctivae and secretions clear ?           Ears- Hearing, canals-normal ?           Nose- Clear, no-Septal dev, mucus, polyps, erosion, perforation  ?           Throat- Mallampati III, mucosa clear , drainage- none, tonsils- atrophic, +teeth ?Neck- flexible , trachea midline, no stridor , thyroid nl, carotid no bruit ?Chest - symmetrical excursion , unlabored ?          Heart/CV- RRR , no murmur , no gallop  , no rub, nl s1 s2 ?                          - JVD- none , edema- none, stasis changes- none, varices- none ?          Lung-  clear to P&A, wheeze- none, cough- none , dullness-none, rub- none ?          Chest wall-  ?Abd-  ?Br/ Gen/ Rectal- Not done, not indicated ?Extrem- cyanosis- none, clubbing, none, atrophy- none, strength-  nl ?Neuro- grossly intact to observation ? ?

## 2021-07-30 ENCOUNTER — Ambulatory Visit (INDEPENDENT_AMBULATORY_CARE_PROVIDER_SITE_OTHER): Payer: BC Managed Care – PPO | Admitting: Internal Medicine

## 2021-07-30 ENCOUNTER — Encounter: Payer: Self-pay | Admitting: Internal Medicine

## 2021-07-30 ENCOUNTER — Other Ambulatory Visit: Payer: Self-pay

## 2021-07-30 VITALS — BP 112/80 | HR 55 | Temp 98.2°F | Ht 67.0 in | Wt 166.4 lb

## 2021-07-30 DIAGNOSIS — R0683 Snoring: Secondary | ICD-10-CM | POA: Diagnosis not present

## 2021-07-30 DIAGNOSIS — G4734 Idiopathic sleep related nonobstructive alveolar hypoventilation: Secondary | ICD-10-CM | POA: Diagnosis not present

## 2021-07-30 DIAGNOSIS — F514 Sleep terrors [night terrors]: Secondary | ICD-10-CM | POA: Diagnosis not present

## 2021-07-30 NOTE — Assessment & Plan Note (Signed)
Indicated by her smart watch.  In association with snoring, suspicion is that she may have OSA. ?Plan-sleep study ?

## 2021-07-30 NOTE — Patient Instructions (Signed)
Order- schedule home sleep test      dx snoring  Please callus for results and recommendations about 2 weeks after your sleep test  

## 2021-07-30 NOTE — Assessment & Plan Note (Signed)
She describes sleep paralysis events as unpleasant.  No cataplexy.  I do not think we are dealing with narcolepsy.  We can consider formal NPSG/MSLT later if necessary. ?

## 2021-07-30 NOTE — Addendum Note (Signed)
Addended by: Elby Beck R on: 07/30/2021 02:57 PM ? ? Modules accepted: Orders ? ?

## 2021-10-15 ENCOUNTER — Encounter: Payer: Self-pay | Admitting: Internal Medicine

## 2021-10-17 ENCOUNTER — Other Ambulatory Visit (HOSPITAL_COMMUNITY): Payer: Self-pay

## 2021-10-17 ENCOUNTER — Telehealth: Payer: Self-pay | Admitting: Pharmacy Technician

## 2021-10-17 NOTE — Telephone Encounter (Signed)
Patient Advocate Encounter Received notification from [facility] regarding a prior authorization for OMEPRAZOLE 20MG . Authorization has been APPROVED from 6.8.23 to 6.7.24.    Authorization # PA# [id number] Exchange 5T Cecelia Byars SS     Received notification from [employer] that prior authorization for OMEPRAZOLE 20MG  is required.   PA submitted on 6.8.23 Key [id number] Status is pending   [facility] will continue to follow  , CPhT Patient Advocate Phone: [phone]

## 2021-10-23 ENCOUNTER — Ambulatory Visit: Payer: BC Managed Care – PPO

## 2021-10-23 DIAGNOSIS — R0683 Snoring: Secondary | ICD-10-CM

## 2021-10-23 DIAGNOSIS — G4734 Idiopathic sleep related nonobstructive alveolar hypoventilation: Secondary | ICD-10-CM

## 2021-10-25 DIAGNOSIS — R0683 Snoring: Secondary | ICD-10-CM

## 2021-11-27 ENCOUNTER — Other Ambulatory Visit (HOSPITAL_BASED_OUTPATIENT_CLINIC_OR_DEPARTMENT_OTHER): Payer: Self-pay | Admitting: Registered Nurse

## 2021-11-27 ENCOUNTER — Other Ambulatory Visit: Payer: Self-pay | Admitting: Registered Nurse

## 2021-11-27 ENCOUNTER — Encounter (HOSPITAL_BASED_OUTPATIENT_CLINIC_OR_DEPARTMENT_OTHER): Payer: Self-pay

## 2021-11-27 ENCOUNTER — Ambulatory Visit (HOSPITAL_BASED_OUTPATIENT_CLINIC_OR_DEPARTMENT_OTHER)
Admission: RE | Admit: 2021-11-27 | Discharge: 2021-11-27 | Disposition: A | Discharge status: Home / Self Care | Payer: BC Managed Care – PPO | Source: Ambulatory Visit | Attending: Registered Nurse | Admitting: Registered Nurse

## 2021-11-27 DIAGNOSIS — R1031 Right lower quadrant pain: Secondary | ICD-10-CM | POA: Insufficient documentation

## 2021-11-27 MED ORDER — IOHEXOL 300 MG/ML  SOLN
75.0000 mL | Freq: Once | INTRAMUSCULAR | Status: AC | PRN
  Administered 2021-11-27: 80 mL via INTRAVENOUS

## 2022-02-06 LAB — HEPATITIS C ANTIBODY: HCV Ab: NEGATIVE

## 2022-02-06 LAB — OB RESULTS CONSOLE ABO/RH: RH Type: POSITIVE

## 2022-02-06 LAB — OB RESULTS CONSOLE HIV ANTIBODY (ROUTINE TESTING): HIV: NONREACTIVE

## 2022-02-06 LAB — OB RESULTS CONSOLE ANTIBODY SCREEN: Antibody Screen: NEGATIVE

## 2022-02-06 LAB — OB RESULTS CONSOLE HEPATITIS B SURFACE ANTIGEN: Hepatitis B Surface Ag: NEGATIVE

## 2022-02-06 LAB — OB RESULTS CONSOLE RPR: RPR: NONREACTIVE

## 2022-02-06 LAB — OB RESULTS CONSOLE RUBELLA ANTIBODY, IGM: Rubella: IMMUNE

## 2022-02-24 LAB — OB RESULTS CONSOLE GC/CHLAMYDIA
Chlamydia: NEGATIVE
Neisseria Gonorrhea: NEGATIVE

## 2022-05-12 NOTE — L&D Delivery Note (Signed)
Delivery Note  Fever to 102 given amp/gen for presumed chorio but abx not in before baby delivered SVD viable female Apgars 9,9 over 2nd degree ML lac.  Placenta delivered spontaneously intact with 3VC. Repair with 2-0 with good support and hemostasis noted.  R/V exam confirms.Some atony massaged to firm MEU clots retrieved no retained POC.  TXA 1 gram given .   Mother and baby to couplet care and are doing well.  EBL 307 cc  Candice Camp, MD

## 2022-08-13 LAB — OB RESULTS CONSOLE GBS: GBS: NEGATIVE

## 2022-08-15 ENCOUNTER — Inpatient Hospital Stay (HOSPITAL_COMMUNITY)
Admission: AD | Admit: 2022-08-15 | Discharge: 2022-08-15 | Disposition: A | Discharge status: Home / Self Care | Payer: BC Managed Care – PPO | Attending: Obstetrics and Gynecology | Admitting: Obstetrics and Gynecology

## 2022-08-15 ENCOUNTER — Encounter (HOSPITAL_COMMUNITY): Payer: Self-pay | Admitting: *Deleted

## 2022-08-15 ENCOUNTER — Other Ambulatory Visit: Payer: Self-pay

## 2022-08-15 DIAGNOSIS — O133 Gestational [pregnancy-induced] hypertension without significant proteinuria, third trimester: Secondary | ICD-10-CM | POA: Insufficient documentation

## 2022-08-15 DIAGNOSIS — Z3A36 36 weeks gestation of pregnancy: Secondary | ICD-10-CM | POA: Diagnosis not present

## 2022-08-15 LAB — URINALYSIS, ROUTINE W REFLEX MICROSCOPIC
Bilirubin Urine: NEGATIVE
Glucose, UA: NEGATIVE mg/dL
Hgb urine dipstick: NEGATIVE
Ketones, ur: NEGATIVE mg/dL
Nitrite: NEGATIVE
Protein, ur: NEGATIVE mg/dL
Specific Gravity, Urine: 1.009 (ref 1.005–1.030)
pH: 7 (ref 5.0–8.0)

## 2022-08-15 LAB — COMPREHENSIVE METABOLIC PANEL
ALT: 7 U/L (ref 0–44)
AST: 18 U/L (ref 15–41)
Albumin: 3 g/dL — ABNORMAL LOW (ref 3.5–5.0)
Alkaline Phosphatase: 97 U/L (ref 38–126)
Anion gap: 12 (ref 5–15)
BUN: 9 mg/dL (ref 6–20)
CO2: 19 mmol/L — ABNORMAL LOW (ref 22–32)
Calcium: 8.9 mg/dL (ref 8.9–10.3)
Chloride: 103 mmol/L (ref 98–111)
Creatinine, Ser: 0.68 mg/dL (ref 0.44–1.00)
GFR, Estimated: >60 mL/min (ref >60)
Glucose, Bld: 76 mg/dL (ref 70–99)
Potassium: 3.6 mmol/L (ref 3.5–5.1)
Sodium: 134 mmol/L — ABNORMAL LOW (ref 135–145)
Total Bilirubin: 0.4 mg/dL (ref 0.3–1.2)
Total Protein: 6.3 g/dL — ABNORMAL LOW (ref 6.5–8.1)

## 2022-08-15 LAB — CBC
HCT: 38.8 % (ref 36.0–46.0)
Hemoglobin: 13 g/dL (ref 12.0–15.0)
MCH: 29.6 pg (ref 26.0–34.0)
MCHC: 33.5 g/dL (ref 30.0–36.0)
MCV: 88.4 fL (ref 80.0–100.0)
Platelets: 208 10*3/uL (ref 150–400)
RBC: 4.39 MIL/uL (ref 3.87–5.11)
RDW: 13.4 % (ref 11.5–15.5)
WBC: 10.2 10*3/uL (ref 4.0–10.5)
nRBC: 0 % (ref 0.0–0.2)

## 2022-08-15 LAB — PROTEIN / CREATININE RATIO, URINE
Creatinine, Urine: 49 mg/dL
Total Protein, Urine: <6 mg/dL

## 2022-08-15 MED ORDER — METOCLOPRAMIDE HCL 10 MG PO TABS
10.0000 mg | ORAL_TABLET | Freq: Once | ORAL | Status: AC
  Administered 2022-08-15: 10 mg via ORAL
  Filled 2022-08-15: qty 1

## 2022-08-15 MED ORDER — CYCLOBENZAPRINE HCL 5 MG PO TABS
10.0000 mg | ORAL_TABLET | Freq: Once | ORAL | Status: AC
  Administered 2022-08-15: 10 mg via ORAL
  Filled 2022-08-15: qty 2

## 2022-08-15 MED ORDER — ACETAMINOPHEN-CAFFEINE 500-65 MG PO TABS
2.0000 | ORAL_TABLET | Freq: Once | ORAL | Status: AC
  Administered 2022-08-15: 2 via ORAL
  Filled 2022-08-15: qty 2

## 2022-08-15 MED ORDER — CYCLOBENZAPRINE HCL 10 MG PO TABS: 10.0000 mg | ORAL_TABLET | Freq: Two times a day (BID) | ORAL | 0 refills | Status: DC | PRN

## 2022-08-15 NOTE — Discharge Instructions (Signed)

## 2022-08-15 NOTE — MAU Provider Note (Signed)
History     CSN: [id number]  Arrival date and time: 08/15/22 1402   Event Date/Time   First Provider Initiated Contact with Patient 08/15/22 1519      Chief Complaint  Patient presents with   BP Evaluation   HPI  Kimberly Larson is a 32 y.o. G1P0 at [redacted]w[redacted]d who presents for evaluation of elevated blood pressures. Patient reports she was diagnosed with gestational hypertension in early March and started on Procardia 30mg . She states this week her BPs were higher so they increased her dose to 60mg  and did labs on Tuesday which were normal. She reports she went to the office today and had severe range blood pressures. She also reports a headache. Patient rates the pain as a 9/10 and has not tried anything for the pain. She reports it feels like someone is squeezing her head. She denies any visual changes or epigastric pain. She denies any vaginal bleeding, discharge, and leaking of fluid. Denies any constipation, diarrhea or any urinary complaints. Reports normal fetal movement.   OB History     Gravida  1   Para      Term      Preterm      AB      Living         SAB      IAB      Ectopic      Multiple      Live Births              Past Medical History:  Diagnosis Date   ADD (attention deficit disorder)    Angio-edema    Chronic sinus infection    GERD (gastroesophageal reflux disease)    Hypertension    Iron deficiency    Irritable bowel syndrome    Personal history of urinary (tract) infection    Raynaud's disease    Seasonal allergies    Urinary frequency 05/15/11    Past Surgical History:  Procedure Laterality Date   COLONOSCOPY     WISDOM TOOTH EXTRACTION      Family History  Problem Relation Age of Onset   Hypertension Mother    Luiz Blare' disease Mother    Crohn's disease Mother    Irritable bowel syndrome Mother    Other Father        trigeminal neuralgia   Asthma Sister    Heart disease Maternal Grandmother    Irritable bowel syndrome  Maternal Grandmother    Heart disease Maternal Grandfather    Irritable bowel syndrome Maternal Grandfather     Social History   Tobacco Use   Smoking status: Never   Smokeless tobacco: Never  Vaping Use   Vaping Use: Never used  Substance Use Topics   Alcohol use: Yes    Alcohol/week: 4.0 standard drinks of alcohol    Types: 4 Cans of beer per week    Comment: some weeks   Drug use: No    Allergies:  Allergies  Allergen Reactions   Ciprofloxacin Hcl Nausea Only   Nitrofurantoin Other (See Comments)    Other reaction(s): Vomiting Other reaction(s): Vomiting   Other Other (See Comments), Nausea Only and Nausea And Vomiting    No medications prior to admission.    Review of Systems  Constitutional: Negative.  Negative for fatigue and fever.  HENT: Negative.    Respiratory: Negative.  Negative for shortness of breath.   Cardiovascular: Negative.  Negative for chest pain.  Gastrointestinal: Negative.  Negative for abdominal  pain, constipation, diarrhea, nausea and vomiting.  Genitourinary: Negative.  Negative for dysuria.  Neurological:  Positive for headaches. Negative for dizziness.   Physical Exam   Blood pressure 124/68, pulse 79, temperature 98.2 F (36.8 C), temperature source Oral, resp. rate 18, height 5\' 7"  (1.702 m), weight 101.8 kg, SpO2 97 %.  Patient Vitals for the past 24 hrs:  BP Temp Temp src Pulse Resp SpO2 Height Weight  08/15/22 1835 -- -- -- -- -- 97 % -- --  08/15/22 1830 -- -- -- -- -- 98 % -- --  08/15/22 1825 -- -- -- -- -- 97 % -- --  08/15/22 1820 -- -- -- -- -- 98 % -- --  08/15/22 1815 124/68 -- -- 79 -- 97 % -- --  08/15/22 1755 -- -- -- -- -- 99 % -- --  08/15/22 1750 -- -- -- -- -- 98 % -- --  08/15/22 1745 (!) 144/84 -- -- 88 -- 97 % -- --  08/15/22 1740 -- -- -- -- -- 98 % -- --  08/15/22 1735 -- -- -- -- -- 98 % -- --  08/15/22 1730 127/80 -- -- 77 -- 97 % -- --  08/15/22 1725 -- -- -- -- -- 97 % -- --  08/15/22 1720 -- -- --  -- -- 98 % -- --  08/15/22 1715 (!) 150/81 -- -- 86 -- 97 % -- --  08/15/22 1710 -- -- -- -- -- 97 % -- --  08/15/22 1705 -- -- -- -- -- 97 % -- --  08/15/22 1700 135/86 -- -- (!) 104 -- 97 % -- --  08/15/22 1655 138/89 -- -- 100 -- -- -- --  08/15/22 1645 (!) 146/83 -- -- 79 -- 96 % -- --  08/15/22 1640 (!) 145/88 -- -- 91 -- 97 % -- --  08/15/22 1635 -- -- -- -- -- 97 % -- --  08/15/22 1630 -- -- -- -- -- 97 % -- --  08/15/22 1625 -- -- -- -- -- 97 % -- --  08/15/22 1620 -- -- -- -- -- 96 % -- --  08/15/22 1615 (!) 147/78 -- -- 85 -- 96 % -- --  08/15/22 1610 -- -- -- -- -- 97 % -- --  08/15/22 1605 -- -- -- -- -- 97 % -- --  08/15/22 1600 135/80 -- -- 80 -- 96 % -- --  08/15/22 1555 -- -- -- -- -- 96 % -- --  08/15/22 1550 -- -- -- -- -- 98 % -- --  08/15/22 1545 (!) 135/90 -- -- 84 18 96 % -- --  08/15/22 1535 -- -- -- -- -- 98 % -- --  08/15/22 1530 (!) 142/94 -- -- 80 -- 98 % -- --  08/15/22 1525 -- -- -- -- -- 99 % -- --  08/15/22 1520 (!) 147/89 -- -- 86 -- 97 % -- --  08/15/22 1519 (!) 147/89 -- -- 76 18 99 % -- --  08/15/22 1515 -- -- -- -- -- 98 % -- --  08/15/22 1454 (!) 148/92 98.2 F (36.8 C) Oral 77 18 98 % -- --  08/15/22 1449 -- -- -- -- -- -- 5\' 7"  (1.702 m) 101.8 kg    Physical Exam Vitals and nursing note reviewed.  Constitutional:      General: She is not in acute distress.    Appearance: She is well-developed.  HENT:     Head: Normocephalic.  Eyes:  Pupils: Pupils are equal, round, and reactive to light.  Cardiovascular:     Rate and Rhythm: Normal rate and regular rhythm.     Heart sounds: Normal heart sounds.  Pulmonary:     Effort: Pulmonary effort is normal. No respiratory distress.     Breath sounds: Normal breath sounds.  Abdominal:     General: Bowel sounds are normal. There is no distension.     Palpations: Abdomen is soft.     Tenderness: There is no abdominal tenderness.  Skin:    General: Skin is warm and dry.  Neurological:      Mental Status: She is alert and oriented to person, place, and time.  Psychiatric:        Mood and Affect: Mood normal.        Behavior: Behavior normal.        Thought Content: Thought content normal.        Judgment: Judgment normal.     Fetal Tracing:  Baseline: 130 Variability: moderate Accels: 15x15 Decels: none  Toco: none  MAU Course  Procedures  Results for orders placed or performed during the hospital encounter of 08/15/22 (from the past 24 hour(s))  Protein / creatinine ratio, urine     Status: None   Collection Time: 08/15/22  3:15 PM  Result Value Ref Range   Creatinine, Urine 49 mg/dL   Total Protein, Urine <6 mg/dL   Protein Creatinine Ratio        0.00 - 0.15 mg/mg[Cre]  Urinalysis, Routine w reflex microscopic -Urine, Clean Catch     Status: Abnormal   Collection Time: 08/15/22  3:15 PM  Result Value Ref Range   Color, Urine YELLOW YELLOW   APPearance CLEAR CLEAR   Specific Gravity, Urine 1.009 1.005 - 1.030   pH 7.0 5.0 - 8.0   Glucose, UA NEGATIVE NEGATIVE mg/dL   Hgb urine dipstick NEGATIVE NEGATIVE   Bilirubin Urine NEGATIVE NEGATIVE   Ketones, ur NEGATIVE NEGATIVE mg/dL   Protein, ur NEGATIVE NEGATIVE mg/dL   Nitrite NEGATIVE NEGATIVE   Leukocytes,Ua LARGE (A) NEGATIVE   RBC / HPF 0-5 0 - 5 RBC/hpf   WBC, UA 11-20 0 - 5 WBC/hpf   Bacteria, UA RARE (A) NONE SEEN   Squamous Epithelial / HPF 0-5 0 - 5 /HPF   Mucus PRESENT   CBC     Status: None   Collection Time: 08/15/22  3:50 PM  Result Value Ref Range   WBC 10.2 4.0 - 10.5 K/uL   RBC 4.39 3.87 - 5.11 MIL/uL   Hemoglobin 13.0 12.0 - 15.0 g/dL   HCT 96.038.8 45.436.0 - 09.846.0 %   MCV 88.4 80.0 - 100.0 fL   MCH 29.6 26.0 - 34.0 pg   MCHC 33.5 30.0 - 36.0 g/dL   RDW 11.913.4 14.711.5 - 82.915.5 %   Platelets 208 150 - 400 K/uL   nRBC 0.0 0.0 - 0.2 %  Comprehensive metabolic panel     Status: Abnormal   Collection Time: 08/15/22  3:50 PM  Result Value Ref Range   Sodium 134 (L) 135 - 145 mmol/L   Potassium  3.6 3.5 - 5.1 mmol/L   Chloride 103 98 - 111 mmol/L   CO2 19 (L) 22 - 32 mmol/L   Glucose, Bld 76 70 - 99 mg/dL   BUN 9 6 - 20 mg/dL   Creatinine, Ser 5.620.68 0.44 - 1.00 mg/dL   Calcium 8.9 8.9 - 13.010.3 mg/dL   Total Protein 6.3 (  L) 6.5 - 8.1 g/dL   Albumin 3.0 (L) 3.5 - 5.0 g/dL   AST 18 15 - 41 U/L   ALT 7 0 - 44 U/L   Alkaline Phosphatase 97 38 - 126 U/L   Total Bilirubin 0.4 0.3 - 1.2 mg/dL   GFR, Estimated >16>60 >10>60 mL/min   Anion gap 12 5 - 15     MDM Prenatal records from community office reviewed. [facility] ordered and reviewed.   UA CBC, CMP, Protein/creat ratio  Excedrin Tension Reglan  Patient reports HA is now 7/10. Discussed importance of differentiating if HA is BP or able to be treated. Patient agreeable to try flexeril   Flexeril PO- patient reports significant improvement of HA  CNM consulted with Dr. Shawnie PonsPratt regarding presentation and results- MD recommends strict preeclampsia precautions and follow up as scheduled in the office for antenatal testing. Patient already has IOL scheduled for 37 weeks  Assessment and Plan   1. Gestational hypertension, third trimester   2. [redacted] weeks gestation of pregnancy    -Discharge home in stable condition -Rx for flexeril sent to pharmacy -Strict preeclampsia precautions discussed -Patient advised to follow-up with OB as scheduled for prenatal care -Patient may return to MAU as needed or if her condition were to change or worsen  Rolm BookbinderCaroline M Devanny Palecek, CNM 08/15/2022, 3:19 PM

## 2022-08-15 NOTE — MAU Note (Addendum)
SHARTAVIA CAINES is a 32 y.o. at [redacted]w[redacted]d here in MAU reporting: sent from [facility] office for BP evaluation. Currently has HA, denies visual disturbances and epigastric pain.  Reports also has "a little bit" of chest pain.  Hasn't taken any meds to treat HA. Denies VB or LOF.  Endorses +FM. IOL 08/26/2022 LMP: NA Onset of complaint: today Pain score: 9 Vitals:   08/15/22 1454  BP: (!) 148/92  Pulse: 77  Resp: 18  Temp: 98.2 F (36.8 C)  SpO2: 98%     FHT: 152 bpm Lab orders placed from triage: UA

## 2022-08-16 LAB — CULTURE, OB URINE

## 2022-08-17 ENCOUNTER — Inpatient Hospital Stay (HOSPITAL_COMMUNITY)
Admission: AD | Admit: 2022-08-17 | Discharge: 2022-08-17 | Disposition: A | Discharge status: Home / Self Care | Payer: BC Managed Care – PPO | Attending: Obstetrics and Gynecology | Admitting: Obstetrics and Gynecology

## 2022-08-17 ENCOUNTER — Encounter (HOSPITAL_COMMUNITY): Payer: Self-pay | Admitting: Obstetrics and Gynecology

## 2022-08-17 DIAGNOSIS — O99613 Diseases of the digestive system complicating pregnancy, third trimester: Secondary | ICD-10-CM | POA: Diagnosis not present

## 2022-08-17 DIAGNOSIS — O133 Gestational [pregnancy-induced] hypertension without significant proteinuria, third trimester: Secondary | ICD-10-CM | POA: Diagnosis not present

## 2022-08-17 DIAGNOSIS — Z3A36 36 weeks gestation of pregnancy: Secondary | ICD-10-CM | POA: Insufficient documentation

## 2022-08-17 DIAGNOSIS — O26893 Other specified pregnancy related conditions, third trimester: Secondary | ICD-10-CM | POA: Diagnosis present

## 2022-08-17 DIAGNOSIS — O10913 Unspecified pre-existing hypertension complicating pregnancy, third trimester: Secondary | ICD-10-CM | POA: Diagnosis not present

## 2022-08-17 DIAGNOSIS — O99413 Diseases of the circulatory system complicating pregnancy, third trimester: Secondary | ICD-10-CM | POA: Insufficient documentation

## 2022-08-17 DIAGNOSIS — Z3689 Encounter for other specified antenatal screening: Secondary | ICD-10-CM

## 2022-08-17 LAB — CBC
HCT: 35.9 % — ABNORMAL LOW (ref 36.0–46.0)
Hemoglobin: 12.5 g/dL (ref 12.0–15.0)
MCH: 30.3 pg (ref 26.0–34.0)
MCHC: 34.8 g/dL (ref 30.0–36.0)
MCV: 86.9 fL (ref 80.0–100.0)
Platelets: 203 10*3/uL (ref 150–400)
RBC: 4.13 MIL/uL (ref 3.87–5.11)
RDW: 13.4 % (ref 11.5–15.5)
WBC: 9.9 10*3/uL (ref 4.0–10.5)
nRBC: 0 % (ref 0.0–0.2)

## 2022-08-17 LAB — COMPREHENSIVE METABOLIC PANEL
ALT: 7 U/L (ref 0–44)
AST: 18 U/L (ref 15–41)
Albumin: 2.7 g/dL — ABNORMAL LOW (ref 3.5–5.0)
Alkaline Phosphatase: 84 U/L (ref 38–126)
Anion gap: 11 (ref 5–15)
BUN: 7 mg/dL (ref 6–20)
CO2: 18 mmol/L — ABNORMAL LOW (ref 22–32)
Calcium: 8.8 mg/dL — ABNORMAL LOW (ref 8.9–10.3)
Chloride: 105 mmol/L (ref 98–111)
Creatinine, Ser: 0.64 mg/dL (ref 0.44–1.00)
GFR, Estimated: >60 mL/min (ref >60)
Glucose, Bld: 99 mg/dL (ref 70–99)
Potassium: 3.7 mmol/L (ref 3.5–5.1)
Sodium: 134 mmol/L — ABNORMAL LOW (ref 135–145)
Total Bilirubin: 0.5 mg/dL (ref 0.3–1.2)
Total Protein: 5.8 g/dL — ABNORMAL LOW (ref 6.5–8.1)

## 2022-08-17 LAB — PROTEIN / CREATININE RATIO, URINE
Creatinine, Urine: 74 mg/dL
Protein Creatinine Ratio: 0.08 mg/mg{Cre} (ref 0.00–0.15)
Total Protein, Urine: 6 mg/dL

## 2022-08-17 NOTE — MAU Note (Signed)
Kimberly Larson is a 32 y.o. at [redacted]w[redacted]d here in MAU reporting: BP has been giving her quite the time.  180/115, took some Tylenol for a HA, didn't really help.  Rechecked 167/97.doesn't really know how accurate cuff is. Having some floaters, first noted early this morning.  Is on 60mg  Procardia, took it this morning.  Had some upper abd pain yesterday, but it has gone away.swelling in hands and feet has increased.  No bleeding, no LOF, reports +FM. Onset of complaint: ongoing Pain score: HA 5 Vitals:   08/17/22 1300  BP: (!) 147/91  Pulse: 89  Resp: 20  Temp: 99 F (37.2 C)  SpO2: 97%     FHT:136 Lab orders placed from triage:  urine collected

## 2022-08-17 NOTE — MAU Provider Note (Signed)
Chief Complaint:  Headache, Hypertension, and visual changes  HPI   Event Date/Time   First Provider Initiated Contact with Patient 08/17/22 1326     Kimberly Larson is a 31 y.o. G1P0 at [redacted]w[redacted]d who presents to maternity admissions reporting headache with black spots and increased BP (readings at home were 180/115 and 167/97).   At 0830 she got up (no headache, normal BP) and took her procardia. A couple of hours later she started to develop a severe headache, described as intense squeezing pain, with visual disturbances. Took Tylenol which did not initially help but relieved headache from severe to "mid-range" which she thinks has more to do with not eating. Had some RUQ pain last night but none now. Visual black spots have also gotten better since MAU arrival. No other physical complaints, feeling plenty of fetal movement.  Pregnancy Course: Receives care from [facility]. Was started on labetalol about 3 weeks ago but could not tolerate it (made her feel weak and dizzy), then switched to procardia two weeks ago and increased to 60mg  daily this past Tuesday. Has an induction scheduled 08/26/22.  Past Medical History:  Diagnosis Date   ADD (attention deficit disorder)    Angio-edema    Chronic sinus infection    GERD (gastroesophageal reflux disease)    Hypertension    Iron deficiency    Irritable bowel syndrome    Personal history of urinary (tract) infection    Raynaud's disease    Seasonal allergies    Urinary frequency 05/15/11   OB History  Gravida Para Term Preterm AB Living  1            SAB IAB Ectopic Multiple Live Births               # Outcome Date GA Lbr Len/2nd Weight Sex Delivery Anes PTL Lv  1 Current            Past Surgical History:  Procedure Laterality Date   COLONOSCOPY     WISDOM TOOTH EXTRACTION     Family History  Problem Relation Age of Onset   Hypertension Mother    Luiz Blare' disease Mother    Crohn's disease Mother    Irritable bowel syndrome  Mother    Other Father        trigeminal neuralgia   Asthma Sister    Heart disease Maternal Grandmother    Irritable bowel syndrome Maternal Grandmother    Heart disease Maternal Grandfather    Irritable bowel syndrome Maternal Grandfather    Social History   Tobacco Use   Smoking status: Never   Smokeless tobacco: Never  Vaping Use   Vaping Use: Never used  Substance Use Topics   Alcohol use: Not Currently    Alcohol/week: 4.0 standard drinks of alcohol    Types: 4 Cans of beer per week    Comment: some weeks   Drug use: No   Allergies  Allergen Reactions   Ciprofloxacin Hcl Nausea Only   Nitrofurantoin Other (See Comments)    Other reaction(s): Vomiting Other reaction(s): Vomiting   Other Other (See Comments), Nausea Only and Nausea And Vomiting   No medications prior to admission.   I have reviewed patient's Past Medical Hx, Surgical Hx, Family Hx, Social Hx, medications and allergies.   ROS  Pertinent items noted in HPI and remainder of comprehensive ROS otherwise negative.   PHYSICAL EXAM  Patient Vitals for the past 24 hrs:  BP Temp Temp src Pulse  Resp SpO2 Height Weight  08/17/22 1552 (!) 147/91 -- -- 81 -- -- -- --  08/17/22 1501 (!) 147/87 -- -- 90 -- 98 % -- --  08/17/22 1446 (!) 141/85 -- -- 86 -- 98 % -- --  08/17/22 1442 138/82 -- -- 85 -- -- -- --  08/17/22 1400 (!) 152/94 -- -- 85 -- 97 % -- --  08/17/22 1345 (!) 143/88 -- -- 79 -- 98 % -- --  08/17/22 1330 (!) 146/89 -- -- 83 -- 98 % -- --  08/17/22 1320 (!) 151/97 -- -- 84 -- -- -- --  08/17/22 1300 (!) 147/91 99 F (37.2 C) Oral 89 20 97 % 5\' 7"  (1.702 m) 227 lb 6.4 oz (103.1 kg)   Constitutional: Well-developed, well-nourished female in no acute distress.  Cardiovascular: normal rate & rhythm, warm and well-perfused Respiratory: normal effort, no problems with respiration noted GI: Abd soft, non-tender, non-distended MS: Extremities nontender, no edema, normal ROM Neurologic: Alert and  oriented x 4.  GU: no CVA tenderness Pelvic: exam deferred  Fetal Tracing: reactive Baseline: 135 Variability: moderate Accelerations: 15x15 Decelerations: none Toco: relaxed   Labs: Results for orders placed or performed during the hospital encounter of 08/17/22 (from the past 24 hour(s))  CBC     Status: Abnormal   Collection Time: 08/17/22  1:35 PM  Result Value Ref Range   WBC 9.9 4.0 - 10.5 K/uL   RBC 4.13 3.87 - 5.11 MIL/uL   Hemoglobin 12.5 12.0 - 15.0 g/dL   HCT 97.2 (L) 82.0 - 60.1 %   MCV 86.9 80.0 - 100.0 fL   MCH 30.3 26.0 - 34.0 pg   MCHC 34.8 30.0 - 36.0 g/dL   RDW 56.1 53.7 - 94.3 %   Platelets 203 150 - 400 K/uL   nRBC 0.0 0.0 - 0.2 %  Comprehensive metabolic panel     Status: Abnormal   Collection Time: 08/17/22  1:35 PM  Result Value Ref Range   Sodium 134 (L) 135 - 145 mmol/L   Potassium 3.7 3.5 - 5.1 mmol/L   Chloride 105 98 - 111 mmol/L   CO2 18 (L) 22 - 32 mmol/L   Glucose, Bld 99 70 - 99 mg/dL   BUN 7 6 - 20 mg/dL   Creatinine, Ser 2.76 0.44 - 1.00 mg/dL   Calcium 8.8 (L) 8.9 - 10.3 mg/dL   Total Protein 5.8 (L) 6.5 - 8.1 g/dL   Albumin 2.7 (L) 3.5 - 5.0 g/dL   AST 18 15 - 41 U/L   ALT 7 0 - 44 U/L   Alkaline Phosphatase 84 38 - 126 U/L   Total Bilirubin 0.5 0.3 - 1.2 mg/dL   GFR, Estimated >14 >70 mL/min   Anion gap 11 5 - 15  Protein / creatinine ratio, urine     Status: None   Collection Time: 08/17/22  1:42 PM  Result Value Ref Range   Creatinine, Urine 74 mg/dL   Total Protein, Urine 6 mg/dL   Protein Creatinine Ratio 0.08 0.00 - 0.15 mg/mg[Cre]    Imaging:  No results found.  MDM & MAU COURSE  MDM: Moderate  MAU Course: Orders Placed This Encounter  Procedures   CBC   Comprehensive metabolic panel   Protein / creatinine ratio, urine   Discharge patient   No orders of the defined types were placed in this encounter.  PEC labs ordered (normal). Headache relieved down to 2-3/10 after pt ate lunch. Discussed presentation  with Dr. Lorane GellGlasser who will have their office reach out to the pt tomorrow, has a NST scheduled for Tuesday. Reviewed PEC and DFM precautions at length.  ASSESSMENT   1. Gestational hypertension, third trimester   2. [redacted] weeks gestation of pregnancy   3. NST (non-stress test) reactive    PLAN  Discharge home in stable condition with return precautions.     Follow-up Information     , [facility] Follow up.   Why: as scheduled for ongoing prenatal care Contact information: [address] [place] [id number] [phone]                 Allergies as of 08/17/2022       Reactions   Ciprofloxacin Hcl Nausea Only   Nitrofurantoin Other (See Comments)   Other reaction(s): Vomiting Other reaction(s): Vomiting   Other Other (See Comments), Nausea Only, Nausea And Vomiting        Medication List     TAKE these medications    acetaminophen 500 MG tablet Commonly known as: TYLENOL Take 1,000 mg by mouth every 6 (six) hours as needed.   amphetamine-dextroamphetamine 10 MG tablet Commonly known as: ADDERALL Take 10 mg by mouth daily as needed.   cyclobenzaprine 10 MG tablet Commonly known as: FLEXERIL Take 1 tablet (10 mg total) by mouth 2 (two) times daily as needed for muscle spasms.   glycopyrrolate 1 MG tablet Commonly known as: Robinul Take 1 tablet (1 mg total) by mouth 2 (two) times daily.   multivitamin-prenatal 27-0.8 MG Tabs tablet Take 1 tablet by mouth daily at 12 noon.   NIFEdipine 60 MG 24 hr tablet Commonly known as: PROCARDIA XL/NIFEDICAL XL Take 60 mg by mouth daily.   tretinoin 0.05 % cream Commonly known as: RETIN-A Apply 1 application topically daily as needed.   valACYclovir 500 MG tablet Commonly known as: VALTREX Take by mouth as needed.       Edd ArbourJamilla Simmie Garin, CNM, MSN, IBCLC Certified Nurse Midwife, [facility]

## 2022-08-18 ENCOUNTER — Encounter (HOSPITAL_COMMUNITY): Payer: Self-pay | Admitting: Obstetrics and Gynecology

## 2022-08-18 ENCOUNTER — Inpatient Hospital Stay (HOSPITAL_COMMUNITY)
Admission: AD | Admit: 2022-08-18 | Discharge: 2022-08-18 | Disposition: A | Discharge status: Home / Self Care | Payer: BC Managed Care – PPO | Attending: Obstetrics and Gynecology | Admitting: Obstetrics and Gynecology

## 2022-08-18 DIAGNOSIS — O26893 Other specified pregnancy related conditions, third trimester: Secondary | ICD-10-CM | POA: Insufficient documentation

## 2022-08-18 DIAGNOSIS — R519 Headache, unspecified: Secondary | ICD-10-CM | POA: Insufficient documentation

## 2022-08-18 DIAGNOSIS — Z3A36 36 weeks gestation of pregnancy: Secondary | ICD-10-CM | POA: Insufficient documentation

## 2022-08-18 DIAGNOSIS — O133 Gestational [pregnancy-induced] hypertension without significant proteinuria, third trimester: Secondary | ICD-10-CM | POA: Insufficient documentation

## 2022-08-18 DIAGNOSIS — Z8249 Family history of ischemic heart disease and other diseases of the circulatory system: Secondary | ICD-10-CM | POA: Diagnosis not present

## 2022-08-18 LAB — COMPREHENSIVE METABOLIC PANEL
ALT: 8 U/L (ref 0–44)
AST: 17 U/L (ref 15–41)
Albumin: 2.8 g/dL — ABNORMAL LOW (ref 3.5–5.0)
Alkaline Phosphatase: 98 U/L (ref 38–126)
Anion gap: 9 (ref 5–15)
BUN: 9 mg/dL (ref 6–20)
CO2: 22 mmol/L (ref 22–32)
Calcium: 9.1 mg/dL (ref 8.9–10.3)
Chloride: 103 mmol/L (ref 98–111)
Creatinine, Ser: 0.73 mg/dL (ref 0.44–1.00)
GFR, Estimated: >60 mL/min (ref >60)
Glucose, Bld: 90 mg/dL (ref 70–99)
Potassium: 4 mmol/L (ref 3.5–5.1)
Sodium: 134 mmol/L — ABNORMAL LOW (ref 135–145)
Total Bilirubin: 0.7 mg/dL (ref 0.3–1.2)
Total Protein: 5.9 g/dL — ABNORMAL LOW (ref 6.5–8.1)

## 2022-08-18 LAB — PROTEIN / CREATININE RATIO, URINE
Creatinine, Urine: 157 mg/dL
Protein Creatinine Ratio: 0.09 mg/mg{Cre} (ref 0.00–0.15)
Total Protein, Urine: 14 mg/dL

## 2022-08-18 LAB — CBC
HCT: 36.8 % (ref 36.0–46.0)
Hemoglobin: 12.5 g/dL (ref 12.0–15.0)
MCH: 30.2 pg (ref 26.0–34.0)
MCHC: 34 g/dL (ref 30.0–36.0)
MCV: 88.9 fL (ref 80.0–100.0)
Platelets: 198 10*3/uL (ref 150–400)
RBC: 4.14 MIL/uL (ref 3.87–5.11)
RDW: 13.3 % (ref 11.5–15.5)
WBC: 10.5 10*3/uL (ref 4.0–10.5)
nRBC: 0 % (ref 0.0–0.2)

## 2022-08-18 MED ORDER — CYCLOBENZAPRINE HCL 5 MG PO TABS
10.0000 mg | ORAL_TABLET | Freq: Once | ORAL | Status: AC
  Administered 2022-08-18: 10 mg via ORAL
  Filled 2022-08-18: qty 2

## 2022-08-18 NOTE — MAU Provider Note (Signed)
History     CSN: [id number]  Arrival date and time: 08/18/22 1229   Event Date/Time   First Provider Initiated Contact with Patient 08/18/22 1305      Chief Complaint  Patient presents with   Hypertension   Headache   HPI Kimberly Larson is a 32 y.o. G1P0 at [redacted]w[redacted]d who presents to MAU for blood pressure evaluation and headache. Patient was seen in the office today and reports BP was 162/110. She reports a headache today, took 2 extra strength Tylenol around 1015 which she reports has improved her headache somewhat. She currently rates her headache 8/10. She denies vision changes or RUQ/epigastric pain. She reports she currently takes Procardia 60mg  daily however her OB increased her dose to 90mg  daily. She denies contractions, vaginal bleeding, or leaking fluid. She endorses normal fetal movement.   Patient receives [facility] at [facility] and has an IOL scheduled on 4/16. She was instructed to call tomorrow for follow up. She has an NST/BP check scheduled on Friday.  OB History     Gravida  1   Para      Term      Preterm      AB      Living         SAB      IAB      Ectopic      Multiple      Live Births              Past Medical History:  Diagnosis Date   ADD (attention deficit disorder)    Angio-edema    Chronic sinus infection    GERD (gastroesophageal reflux disease)    Hypertension    Iron deficiency    Irritable bowel syndrome    Personal history of urinary (tract) infection    Raynaud's disease    Seasonal allergies    Urinary frequency 05/15/11    Past Surgical History:  Procedure Laterality Date   COLONOSCOPY     WISDOM TOOTH EXTRACTION      Family History  Problem Relation Age of Onset   Hypertension Mother    Graves' disease Mother    Crohn's disease Mother    Irritable bowel syndrome Mother    Other Father        trigeminal neuralgia   Asthma Sister    Heart disease Maternal Grandmother    Irritable bowel syndrome  Maternal Grandmother    Heart disease Maternal Grandfather    Irritable bowel syndrome Maternal Grandfather     Social History   Tobacco Use   Smoking status: Never   Smokeless tobacco: Never  Vaping Use   Vaping Use: Never used  Substance Use Topics   Alcohol use: Not Currently    Alcohol/week: 4.0 standard drinks of alcohol    Types: 4 Cans of beer per week    Comment: some weeks   Drug use: No    Allergies:  Allergies  Allergen Reactions   Ciprofloxacin Hcl Nausea Only   Nitrofurantoin Other (See Comments)    Other reaction(s): Vomiting Other reaction(s): Vomiting   Other Other (See Comments), Nausea Only and Nausea And Vomiting    Medications Prior to Admission  Medication Sig Dispense Refill Last Dose   acetaminophen (TYLENOL) 500 MG tablet Take 1,000 mg by mouth every 6 (six) hours as needed.   08/18/2022   NIFEdipine (PROCARDIA XL/NIFEDICAL XL) 60 MG 24 hr tablet Take 60 mg by mouth daily.   08/18/2022  amphetamine-dextroamphetamine (ADDERALL) 10 MG tablet Take 10 mg by mouth daily as needed.      cyclobenzaprine (FLEXERIL) 10 MG tablet Take 1 tablet (10 mg total) by mouth 2 (two) times daily as needed for muscle spasms. 20 tablet 0    glycopyrrolate (ROBINUL) 1 MG tablet Take 1 tablet (1 mg total) by mouth 2 (two) times daily. 60 tablet 5    Prenatal Vit-Fe Fumarate-FA (MULTIVITAMIN-PRENATAL) 27-0.8 MG TABS tablet Take 1 tablet by mouth daily at 12 noon.      tretinoin (RETIN-A) 0.05 % cream Apply 1 application topically daily as needed.      valACYclovir (VALTREX) 500 MG tablet Take by mouth as needed.      Review of Systems  Neurological:  Positive for headaches.  All other systems reviewed and are negative.  Physical Exam   Patient Vitals for the past 24 hrs:  BP Temp Temp src Pulse SpO2 Height Weight  08/18/22 1500 (!) 135/94 -- -- 90 98 % -- --  08/18/22 1445 (!) 118/96 -- -- 89 97 % -- --  08/18/22 1430 130/86 -- -- 89 97 % -- --  08/18/22 1415 (!)  145/99 -- -- 90 -- -- --  08/18/22 1400 134/86 -- -- 92 97 % -- --  08/18/22 1345 139/89 -- -- 83 97 % -- --  08/18/22 1330 (!) 140/93 -- -- 94 96 % -- --  08/18/22 1315 (!) 139/95 -- -- 90 97 % -- --  08/18/22 1256 (!) 146/97 98.2 F (36.8 C) Oral 85 98 % -- --  08/18/22 1243 -- -- -- -- -- 5\' 7"  (1.702 m) 103 kg   Physical Exam Vitals and nursing note reviewed.  Constitutional:      General: She is not in acute distress. Eyes:     Extraocular Movements: Extraocular movements intact.     Pupils: Pupils are equal, round, and reactive to light.  Cardiovascular:     Rate and Rhythm: Normal rate.  Pulmonary:     Effort: Pulmonary effort is normal. No respiratory distress.  Abdominal:     Palpations: Abdomen is soft.     Tenderness: There is no abdominal tenderness.     Comments: Gravid  Musculoskeletal:        General: Normal range of motion.     Cervical back: Normal range of motion.  Skin:    General: Skin is warm and dry.  Neurological:     General: No focal deficit present.     Mental Status: She is alert and oriented to person, place, and time.     Cranial Nerves: No cranial nerve deficit.  Psychiatric:        Mood and Affect: Mood normal.        Behavior: Behavior normal.   NST FHR: 155 bpm, moderate variability, +15x15 accels, no decels Toco: quiet  Results for orders placed or performed during the hospital encounter of 08/18/22 (from the past 24 hour(s))  Comprehensive metabolic panel     Status: Abnormal   Collection Time: 08/18/22  1:00 PM  Result Value Ref Range   Sodium 134 (L) 135 - 145 mmol/L   Potassium 4.0 3.5 - 5.1 mmol/L   Chloride 103 98 - 111 mmol/L   CO2 22 22 - 32 mmol/L   Glucose, Bld 90 70 - 99 mg/dL   BUN 9 6 - 20 mg/dL   Creatinine, Ser 6.57 0.44 - 1.00 mg/dL   Calcium 9.1 8.9 - 90.3 mg/dL  Total Protein 5.9 (L) 6.5 - 8.1 g/dL   Albumin 2.8 (L) 3.5 - 5.0 g/dL   AST 17 15 - 41 U/L   ALT 8 0 - 44 U/L   Alkaline Phosphatase 98 38 - 126 U/L    Total Bilirubin 0.7 0.3 - 1.2 mg/dL   GFR, Estimated >47>60 >82>60 mL/min   Anion gap 9 5 - 15  CBC     Status: None   Collection Time: 08/18/22  1:00 PM  Result Value Ref Range   WBC 10.5 4.0 - 10.5 K/uL   RBC 4.14 3.87 - 5.11 MIL/uL   Hemoglobin 12.5 12.0 - 15.0 g/dL   HCT 95.636.8 21.336.0 - 08.646.0 %   MCV 88.9 80.0 - 100.0 fL   MCH 30.2 26.0 - 34.0 pg   MCHC 34.0 30.0 - 36.0 g/dL   RDW 57.813.3 46.911.5 - 62.915.5 %   Platelets 198 150 - 400 K/uL   nRBC 0.0 0.0 - 0.2 %  Protein / creatinine ratio, urine     Status: None   Collection Time: 08/18/22  1:03 PM  Result Value Ref Range   Creatinine, Urine 157 mg/dL   Total Protein, Urine 14 mg/dL   Protein Creatinine Ratio 0.09 0.00 - 0.15 mg/mg[Cre]    MAU Course  Procedures  MDM CBC, CMP, UPCR Serial BP's Flexeril  NST   BP's ranging from normotensive to mild range. Labs unremarkable. Patient was offered both Flexeril and Caffeine tablet, however declines the caffeine tablet as caffeine often makes her headaches worse.   On reassessment she reports headache improved, now rating 3-4/10. I offered patient to have headache cocktail but patient desires to go home. Reviewed patient with Dr. Adrian BlackwaterStinson, okay for discharge home with strict return precautions. [facility] today to contact office tomorrow for BP check. She also has an NST scheduled this week.   Assessment and Plan   1. [redacted] weeks gestation of pregnancy   2. Gestational hypertension, third trimester   3. Pregnancy headache in third trimester    - Discharge home in stable condition - Strict return precautions. Return to MAU as needed - Patient to call office today or tomorrow for BP check in 1-2 days   Brand MalesDanielle L Tenasia Aull, CNM 08/18/2022, 4:44 PM

## 2022-08-18 NOTE — MAU Note (Addendum)
.  Kimberly Larson is a 32 y.o. at [redacted]w[redacted]d here in MAU reporting: Sent over from the office for blood pressure eval. She reports her blood pressure was 162/110 in the office this morning around 1040. She reports a current HA that has not gone away since yesterday. She reports she took Tylenol at 1015 and her HA is now a 6/10 from a previous 8/10. Denies visual disturbances. Denies VB or LOF. +FM.  She reports she is currently taking 60 mg Procardia qd but reports if she is discharged today she will be adding 30 mg of Procardia to the evenings.  Onset of complaint: This morning Pain score: 6/10 HA  FHT: 180 initial external Lab orders placed from triage: none

## 2022-08-19 ENCOUNTER — Encounter (HOSPITAL_COMMUNITY): Payer: Self-pay | Admitting: *Deleted

## 2022-08-19 ENCOUNTER — Telehealth (HOSPITAL_COMMUNITY): Payer: Self-pay | Admitting: *Deleted

## 2022-08-19 NOTE — Telephone Encounter (Signed)
Preadmission screen  

## 2022-08-25 ENCOUNTER — Other Ambulatory Visit: Payer: Self-pay

## 2022-08-25 ENCOUNTER — Inpatient Hospital Stay (HOSPITAL_COMMUNITY)
Admission: RE | Admit: 2022-08-25 | Discharge: 2022-08-28 | DRG: 805 | Disposition: A | Discharge status: Home / Self Care | Payer: BC Managed Care – PPO | Attending: Obstetrics and Gynecology | Admitting: Obstetrics and Gynecology

## 2022-08-25 ENCOUNTER — Encounter (HOSPITAL_COMMUNITY): Payer: Self-pay | Admitting: Obstetrics and Gynecology

## 2022-08-25 DIAGNOSIS — O114 Pre-existing hypertension with pre-eclampsia, complicating childbirth: Principal | ICD-10-CM | POA: Diagnosis present

## 2022-08-25 DIAGNOSIS — O9852 Other viral diseases complicating childbirth: Secondary | ICD-10-CM | POA: Diagnosis present

## 2022-08-25 DIAGNOSIS — B001 Herpesviral vesicular dermatitis: Secondary | ICD-10-CM | POA: Diagnosis present

## 2022-08-25 DIAGNOSIS — O1092 Unspecified pre-existing hypertension complicating childbirth: Secondary | ICD-10-CM | POA: Diagnosis present

## 2022-08-25 DIAGNOSIS — Z3A37 37 weeks gestation of pregnancy: Secondary | ICD-10-CM

## 2022-08-25 DIAGNOSIS — O139 Gestational [pregnancy-induced] hypertension without significant proteinuria, unspecified trimester: Principal | ICD-10-CM | POA: Diagnosis present

## 2022-08-25 DIAGNOSIS — O411230 Chorioamnionitis, third trimester, not applicable or unspecified: Secondary | ICD-10-CM | POA: Diagnosis present

## 2022-08-25 LAB — CBC
HCT: 37 % (ref 36.0–46.0)
Hemoglobin: 12.8 g/dL (ref 12.0–15.0)
MCH: 30 pg (ref 26.0–34.0)
MCHC: 34.6 g/dL (ref 30.0–36.0)
MCV: 86.9 fL (ref 80.0–100.0)
Platelets: 211 10*3/uL (ref 150–400)
RBC: 4.26 MIL/uL (ref 3.87–5.11)
RDW: 13.4 % (ref 11.5–15.5)
WBC: 10 10*3/uL (ref 4.0–10.5)
nRBC: 0 % (ref 0.0–0.2)

## 2022-08-25 LAB — COMPREHENSIVE METABOLIC PANEL
ALT: 7 U/L (ref 0–44)
AST: 23 U/L (ref 15–41)
Albumin: 3 g/dL — ABNORMAL LOW (ref 3.5–5.0)
Alkaline Phosphatase: 99 U/L (ref 38–126)
Anion gap: 10 (ref 5–15)
BUN: 15 mg/dL (ref 6–20)
CO2: 20 mmol/L — ABNORMAL LOW (ref 22–32)
Calcium: 9.2 mg/dL (ref 8.9–10.3)
Chloride: 103 mmol/L (ref 98–111)
Creatinine, Ser: 0.83 mg/dL (ref 0.44–1.00)
GFR, Estimated: >60 mL/min (ref >60)
Glucose, Bld: 89 mg/dL (ref 70–99)
Potassium: 3.9 mmol/L (ref 3.5–5.1)
Sodium: 133 mmol/L — ABNORMAL LOW (ref 135–145)
Total Bilirubin: 0.5 mg/dL (ref 0.3–1.2)
Total Protein: 6.1 g/dL — ABNORMAL LOW (ref 6.5–8.1)

## 2022-08-25 LAB — TYPE AND SCREEN
ABO/RH(D): O POS
Antibody Screen: NEGATIVE

## 2022-08-25 LAB — HIV ANTIBODY (ROUTINE TESTING W REFLEX): HIV Screen 4th Generation wRfx: NONREACTIVE

## 2022-08-25 MED ORDER — ACETAMINOPHEN 325 MG PO TABS
650.0000 mg | ORAL_TABLET | ORAL | Status: DC | PRN
  Administered 2022-08-26: 650 mg via ORAL
  Filled 2022-08-25: qty 2

## 2022-08-25 MED ORDER — LACTATED RINGERS IV SOLN: 500.0000 mL | Freq: Once | INTRAVENOUS | Status: DC

## 2022-08-25 MED ORDER — OXYTOCIN-SODIUM CHLORIDE 30-0.9 UT/500ML-% IV SOLN
1.0000 m[IU]/min | INTRAVENOUS | Status: DC
  Administered 2022-08-25: 2 m[IU]/min via INTRAVENOUS
  Filled 2022-08-25: qty 500

## 2022-08-25 MED ORDER — ONDANSETRON HCL 4 MG/2ML IJ SOLN: 4.0000 mg | Freq: Four times a day (QID) | INTRAMUSCULAR | Status: DC | PRN

## 2022-08-25 MED ORDER — OXYTOCIN-SODIUM CHLORIDE 30-0.9 UT/500ML-% IV SOLN
2.5000 [IU]/h | INTRAVENOUS | Status: DC
  Administered 2022-08-26: 2.5 [IU]/h via INTRAVENOUS

## 2022-08-25 MED ORDER — PHENYLEPHRINE 80 MCG/ML (10ML) SYRINGE FOR IV PUSH (FOR BLOOD PRESSURE SUPPORT)
80.0000 ug | PREFILLED_SYRINGE | INTRAVENOUS | Status: DC | PRN
  Filled 2022-08-25: qty 10

## 2022-08-25 MED ORDER — PHENYLEPHRINE 80 MCG/ML (10ML) SYRINGE FOR IV PUSH (FOR BLOOD PRESSURE SUPPORT)
80.0000 ug | PREFILLED_SYRINGE | INTRAVENOUS | Status: DC | PRN
  Administered 2022-08-26: 80 ug via INTRAVENOUS

## 2022-08-25 MED ORDER — NIFEDIPINE ER OSMOTIC RELEASE 30 MG PO TB24
60.0000 mg | ORAL_TABLET | Freq: Every morning | ORAL | Status: DC
  Administered 2022-08-26: 60 mg via ORAL
  Filled 2022-08-25: qty 2

## 2022-08-25 MED ORDER — SOD CITRATE-CITRIC ACID 500-334 MG/5ML PO SOLN: 30.0000 mL | ORAL | Status: DC | PRN

## 2022-08-25 MED ORDER — MISOPROSTOL 50MCG HALF TABLET
50.0000 ug | ORAL_TABLET | ORAL | Status: DC
  Administered 2022-08-25: 50 ug via BUCCAL
  Filled 2022-08-25 (×2): qty 1

## 2022-08-25 MED ORDER — OXYTOCIN BOLUS FROM INFUSION
333.0000 mL | Freq: Once | INTRAVENOUS | Status: AC
  Administered 2022-08-26: 333 mL via INTRAVENOUS

## 2022-08-25 MED ORDER — DIPHENHYDRAMINE HCL 50 MG/ML IJ SOLN: 12.5000 mg | INTRAMUSCULAR | Status: DC | PRN

## 2022-08-25 MED ORDER — LIDOCAINE HCL (PF) 1 % IJ SOLN: 30.0000 mL | INTRAMUSCULAR | Status: DC | PRN

## 2022-08-25 MED ORDER — FENTANYL-BUPIVACAINE-NACL 0.5-0.125-0.9 MG/250ML-% EP SOLN
12.0000 mL/h | EPIDURAL | Status: DC | PRN
  Administered 2022-08-26: 12 mL/h via EPIDURAL
  Filled 2022-08-25: qty 250

## 2022-08-25 MED ORDER — FENTANYL CITRATE (PF) 100 MCG/2ML IJ SOLN
50.0000 ug | INTRAMUSCULAR | Status: DC | PRN
  Administered 2022-08-26: 100 ug via INTRAVENOUS
  Filled 2022-08-25: qty 2

## 2022-08-25 MED ORDER — LACTATED RINGERS IV SOLN
500.0000 mL | INTRAVENOUS | Status: DC | PRN
  Administered 2022-08-26: 500 mL via INTRAVENOUS

## 2022-08-25 MED ORDER — LACTATED RINGERS IV SOLN: INTRAVENOUS | Status: DC

## 2022-08-25 MED ORDER — EPHEDRINE 5 MG/ML INJ: 10.0000 mg | INTRAVENOUS | Status: DC | PRN

## 2022-08-25 MED ORDER — HYDROXYZINE HCL 50 MG PO TABS: 50.0000 mg | ORAL_TABLET | Freq: Four times a day (QID) | ORAL | Status: DC | PRN

## 2022-08-25 MED ORDER — NIFEDIPINE ER OSMOTIC RELEASE 30 MG PO TB24
30.0000 mg | ORAL_TABLET | Freq: Every day | ORAL | Status: DC
  Administered 2022-08-25 – 2022-08-26 (×2): 30 mg via ORAL
  Filled 2022-08-25 (×2): qty 1

## 2022-08-25 NOTE — H&P (Signed)
Kimberly Larson is a 32 y.o. G1P0 at [redacted]w[redacted]d presenting from the office for induction of labor secondary to [facility] vs worsening chronic hypertension. She was previously diagnosed with hypertension outside of pregnancy which completely resolved once she stopped OCPs. Recently, she has been on Procardia 60qAM + 30 qHS, however was found to have severe range blood pressures in the office. She had a HA initially this morning that resolved with tylenol. No vision changes, RUQ pain. NST there reassuring.  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  OB History     Gravida  1   Para      Term      Preterm      AB      Living         SAB      IAB      Ectopic      Multiple      Live Births             Past Medical History:  Diagnosis Date   ADD (attention deficit disorder)    Angio-edema    Chronic sinus infection    Complication of anesthesia    GERD (gastroesophageal reflux disease)    Hypertension    Iron deficiency    Irritable bowel syndrome    Personal history of urinary (tract) infection    Pregnancy induced hypertension    Raynaud's disease    Seasonal allergies    Urinary frequency 05/15/2011   Past Surgical History:  Procedure Laterality Date   COLONOSCOPY     CYSTOSCOPY     DRUG INDUCED ENDOSCOPY     WISDOM TOOTH EXTRACTION     Family History: family history includes Asthma in her sister; Crohn's disease in her mother; Luiz Blare' disease in her mother; Heart disease in her maternal grandfather and maternal grandmother; Hypertension in her mother; Irritable bowel syndrome in her maternal grandfather, maternal grandmother, and mother; Other in her father. Social History:  reports that she has never smoked. She has never used smokeless tobacco. She reports that she does not currently use alcohol after a past usage of about 4.0 standard drinks  of alcohol per week. She reports that she does not use drugs.     Maternal Diabetes: No Genetic Screening: Normal Maternal Ultrasounds/Referrals: Normal Fetal Ultrasounds or other Referrals:  None Maternal Substance Abuse:  No Significant Maternal Medications:  None Significant Maternal Lab Results:  Group B Strep negative Number of Prenatal Visits:greater than 3 verified prenatal visits Other Comments:  None     08/25/2022    5:07 PM 08/18/2022    3:45 PM 08/18/2022    3:30 PM  Vitals with BMI  Systolic 149 141 696  Diastolic 97 89 91  Pulse 91 91 83    Vitals and nursing note reviewed. Exam conducted with a chaperone present.  Constitutional:      Appearance: Normal appearance.  HENT:     Head: Normocephalic.  Eyes:     Pupils: Pupils are equal, round. Cardiovascular:     Rate and Rhythm: Normal rate and regular rhythm.     Pulses: Normal pulses.  Abdominal:     General: Abdomen is Gravid, nontender Neurological:     Mental Status: She is alert.   Cervix was 1-2cm and posterior in the office, vertex presentation  Prenatal labs: ABO, Rh: O/Positive/-- (09/28 0000) Antibody:  Negative (09/28 0000) Rubella: Immune (09/28 0000) RPR: Nonreactive (09/28 0000)  HBsAg: Negative (09/28 0000)  HIV: Non-reactive (09/28 0000)  GBS: Negative/-- (04/03 0000)   Assessment/Plan: 32 y.o. G1P0 at [redacted]w[redacted]d presenting for IOL s/s PIH vs worsening chronic hypertension. Cervix unfavorable - plan for cytotec, then pit/AROM when able  PIH/worsening chronic hypertension - Continue home Procardia 60qAM, 30qHS. Bps mild range on admission. HELLP labs pending. Asymptomatic. Discussed low threshold for magnesium if persistent severe range Bps or symptoms.  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 GBS neg    Tawni Levy 08/25/2022, 4:58 PM

## 2022-08-26 ENCOUNTER — Inpatient Hospital Stay (HOSPITAL_COMMUNITY): Payer: BC Managed Care – PPO

## 2022-08-26 ENCOUNTER — Inpatient Hospital Stay (HOSPITAL_COMMUNITY): Payer: BC Managed Care – PPO | Admitting: Anesthesiology

## 2022-08-26 ENCOUNTER — Encounter (HOSPITAL_COMMUNITY): Payer: Self-pay | Admitting: Obstetrics and Gynecology

## 2022-08-26 LAB — CBC
HCT: 33.9 % — ABNORMAL LOW (ref 36.0–46.0)
HCT: 34.3 % — ABNORMAL LOW (ref 36.0–46.0)
HCT: 37.1 % (ref 36.0–46.0)
Hemoglobin: 11.7 g/dL — ABNORMAL LOW (ref 12.0–15.0)
Hemoglobin: 12.1 g/dL (ref 12.0–15.0)
Hemoglobin: 12.9 g/dL (ref 12.0–15.0)
MCH: 30.1 pg (ref 26.0–34.0)
MCH: 30.2 pg (ref 26.0–34.0)
MCH: 30.5 pg (ref 26.0–34.0)
MCHC: 34.5 g/dL (ref 30.0–36.0)
MCHC: 34.8 g/dL (ref 30.0–36.0)
MCHC: 35.3 g/dL (ref 30.0–36.0)
MCV: 86.4 fL (ref 80.0–100.0)
MCV: 86.5 fL (ref 80.0–100.0)
MCV: 87.6 fL (ref 80.0–100.0)
Platelets: 165 10*3/uL (ref 150–400)
Platelets: 184 10*3/uL (ref 150–400)
Platelets: 218 10*3/uL (ref 150–400)
RBC: 3.87 MIL/uL (ref 3.87–5.11)
RBC: 3.97 MIL/uL (ref 3.87–5.11)
RBC: 4.29 MIL/uL (ref 3.87–5.11)
RDW: 13.2 % (ref 11.5–15.5)
RDW: 13.2 % (ref 11.5–15.5)
RDW: 13.3 % (ref 11.5–15.5)
WBC: 11.2 10*3/uL — ABNORMAL HIGH (ref 4.0–10.5)
WBC: 11.5 10*3/uL — ABNORMAL HIGH (ref 4.0–10.5)
WBC: 18.4 10*3/uL — ABNORMAL HIGH (ref 4.0–10.5)
nRBC: 0 % (ref 0.0–0.2)
nRBC: 0 % (ref 0.0–0.2)
nRBC: 0 % (ref 0.0–0.2)

## 2022-08-26 LAB — RPR: RPR Ser Ql: NONREACTIVE

## 2022-08-26 MED ORDER — PRENATAL MULTIVITAMIN CH
1.0000 | ORAL_TABLET | Freq: Every day | ORAL | Status: DC
  Administered 2022-08-27: 1 via ORAL
  Filled 2022-08-26 (×2): qty 1

## 2022-08-26 MED ORDER — BUPIVACAINE HCL (PF) 0.25 % IJ SOLN
INTRAMUSCULAR | Status: DC | PRN
  Administered 2022-08-26: 8 mL via EPIDURAL

## 2022-08-26 MED ORDER — ACETAMINOPHEN 325 MG PO TABS
650.0000 mg | ORAL_TABLET | ORAL | Status: DC | PRN
  Administered 2022-08-26 – 2022-08-27 (×2): 650 mg via ORAL
  Filled 2022-08-26: qty 2

## 2022-08-26 MED ORDER — EPHEDRINE 5 MG/ML INJ: 10.0000 mg | INTRAVENOUS | Status: DC | PRN

## 2022-08-26 MED ORDER — ACETAMINOPHEN 500 MG PO TABS
1000.0000 mg | ORAL_TABLET | Freq: Four times a day (QID) | ORAL | Status: DC | PRN
  Administered 2022-08-26: 1000 mg via ORAL
  Filled 2022-08-26 (×2): qty 2

## 2022-08-26 MED ORDER — NIFEDIPINE ER OSMOTIC RELEASE 30 MG PO TB24: 90.0000 mg | ORAL_TABLET | Freq: Every day | ORAL | Status: DC

## 2022-08-26 MED ORDER — COCONUT OIL OIL
1.0000 | TOPICAL_OIL | Status: DC | PRN
  Administered 2022-08-28: 1 via TOPICAL

## 2022-08-26 MED ORDER — FENTANYL CITRATE (PF) 100 MCG/2ML IJ SOLN
INTRAMUSCULAR | Status: AC
  Filled 2022-08-26: qty 2

## 2022-08-26 MED ORDER — MEDROXYPROGESTERONE ACETATE 150 MG/ML IM SUSP: 150.0000 mg | INTRAMUSCULAR | Status: DC | PRN

## 2022-08-26 MED ORDER — NIFEDIPINE ER OSMOTIC RELEASE 30 MG PO TB24
60.0000 mg | ORAL_TABLET | Freq: Every morning | ORAL | Status: DC
  Administered 2022-08-27 – 2022-08-28 (×2): 60 mg via ORAL
  Filled 2022-08-26 (×2): qty 2

## 2022-08-26 MED ORDER — SODIUM CHLORIDE 0.9 % IV SOLN
2.0000 g | Freq: Four times a day (QID) | INTRAVENOUS | Status: AC
  Administered 2022-08-27 (×3): 2 g via INTRAVENOUS
  Filled 2022-08-26 (×3): qty 2000

## 2022-08-26 MED ORDER — PHENYLEPHRINE 80 MCG/ML (10ML) SYRINGE FOR IV PUSH (FOR BLOOD PRESSURE SUPPORT): 80.0000 ug | PREFILLED_SYRINGE | INTRAVENOUS | Status: DC | PRN

## 2022-08-26 MED ORDER — TRANEXAMIC ACID-NACL 1000-0.7 MG/100ML-% IV SOLN: 1000.0000 mg | Freq: Once | INTRAVENOUS | Status: DC

## 2022-08-26 MED ORDER — SIMETHICONE 80 MG PO CHEW: 80.0000 mg | CHEWABLE_TABLET | ORAL | Status: DC | PRN

## 2022-08-26 MED ORDER — MEASLES, MUMPS & RUBELLA VAC IJ SOLR: 0.5000 mL | Freq: Once | INTRAMUSCULAR | Status: DC

## 2022-08-26 MED ORDER — ONDANSETRON HCL 4 MG PO TABS: 4.0000 mg | ORAL_TABLET | ORAL | Status: DC | PRN

## 2022-08-26 MED ORDER — FENTANYL CITRATE (PF) 100 MCG/2ML IJ SOLN
INTRAMUSCULAR | Status: DC | PRN
  Administered 2022-08-26: 100 ug via EPIDURAL

## 2022-08-26 MED ORDER — OXYCODONE-ACETAMINOPHEN 5-325 MG PO TABS: 2.0000 | ORAL_TABLET | ORAL | Status: DC | PRN

## 2022-08-26 MED ORDER — ONDANSETRON HCL 4 MG/2ML IJ SOLN: 4.0000 mg | INTRAMUSCULAR | Status: DC | PRN

## 2022-08-26 MED ORDER — DIBUCAINE (PERIANAL) 1 % EX OINT: 1.0000 | TOPICAL_OINTMENT | CUTANEOUS | Status: DC | PRN

## 2022-08-26 MED ORDER — BENZOCAINE-MENTHOL 20-0.5 % EX AERO
1.0000 | INHALATION_SPRAY | CUTANEOUS | Status: DC | PRN
  Administered 2022-08-27: 1 via TOPICAL
  Filled 2022-08-26: qty 56

## 2022-08-26 MED ORDER — DIPHENHYDRAMINE HCL 50 MG/ML IJ SOLN
12.5000 mg | INTRAMUSCULAR | Status: DC | PRN
Start: 2022-08-26 — End: 2022-08-26

## 2022-08-26 MED ORDER — GENTAMICIN SULFATE 40 MG/ML IJ SOLN
5.0000 mg/kg | INTRAVENOUS | Status: AC
  Administered 2022-08-26: 390 mg via INTRAVENOUS
  Filled 2022-08-26: qty 9.75

## 2022-08-26 MED ORDER — OXYCODONE-ACETAMINOPHEN 5-325 MG PO TABS: 1.0000 | ORAL_TABLET | ORAL | Status: DC | PRN

## 2022-08-26 MED ORDER — WITCH HAZEL-GLYCERIN EX PADS: 1.0000 | MEDICATED_PAD | CUTANEOUS | Status: DC | PRN

## 2022-08-26 MED ORDER — IBUPROFEN 600 MG PO TABS
600.0000 mg | ORAL_TABLET | Freq: Four times a day (QID) | ORAL | Status: DC
  Administered 2022-08-27 – 2022-08-28 (×3): 600 mg via ORAL
  Filled 2022-08-26 (×6): qty 1

## 2022-08-26 MED ORDER — ZOLPIDEM TARTRATE 5 MG PO TABS: 5.0000 mg | ORAL_TABLET | Freq: Every evening | ORAL | Status: DC | PRN

## 2022-08-26 MED ORDER — NIFEDIPINE ER OSMOTIC RELEASE 30 MG PO TB24: 90.0000 mg | ORAL_TABLET | Freq: Two times a day (BID) | ORAL | Status: DC

## 2022-08-26 MED ORDER — TETANUS-DIPHTH-ACELL PERTUSSIS 5-2.5-18.5 LF-MCG/0.5 IM SUSY: 0.5000 mL | PREFILLED_SYRINGE | Freq: Once | INTRAMUSCULAR | Status: DC

## 2022-08-26 MED ORDER — TRANEXAMIC ACID-NACL 1000-0.7 MG/100ML-% IV SOLN
INTRAVENOUS | Status: AC
  Administered 2022-08-26: 1000 mg
  Filled 2022-08-26: qty 100

## 2022-08-26 MED ORDER — LACTATED RINGERS IV SOLN: 500.0000 mL | Freq: Once | INTRAVENOUS | Status: DC

## 2022-08-26 MED ORDER — DIPHENHYDRAMINE HCL 25 MG PO CAPS: 25.0000 mg | ORAL_CAPSULE | Freq: Four times a day (QID) | ORAL | Status: DC | PRN

## 2022-08-26 MED ORDER — LIDOCAINE HCL (PF) 1 % IJ SOLN
INTRAMUSCULAR | Status: DC | PRN
  Administered 2022-08-26 (×2): 4 mL via EPIDURAL

## 2022-08-26 MED ORDER — SODIUM CHLORIDE 0.9 % IV SOLN
2.0000 g | Freq: Four times a day (QID) | INTRAVENOUS | Status: DC
  Administered 2022-08-26: 2 g via INTRAVENOUS
  Filled 2022-08-26: qty 2000

## 2022-08-26 MED ORDER — NIFEDIPINE ER OSMOTIC RELEASE 30 MG PO TB24
30.0000 mg | ORAL_TABLET | Freq: Every day | ORAL | Status: DC
  Administered 2022-08-27: 30 mg via ORAL
  Filled 2022-08-26: qty 1

## 2022-08-26 MED ORDER — FENTANYL-BUPIVACAINE-NACL 0.5-0.125-0.9 MG/250ML-% EP SOLN: 12.0000 mL/h | EPIDURAL | Status: DC | PRN

## 2022-08-26 MED ORDER — SENNOSIDES-DOCUSATE SODIUM 8.6-50 MG PO TABS
2.0000 | ORAL_TABLET | Freq: Every day | ORAL | Status: DC
  Filled 2022-08-26 (×3): qty 2

## 2022-08-26 MED ORDER — SODIUM CHLORIDE 0.9 % IV SOLN: 2.0000 g | Freq: Two times a day (BID) | INTRAVENOUS | Status: DC

## 2022-08-26 NOTE — Consult Note (Signed)
ANTIBIOTIC CONSULT NOTE - INITIAL  Pharmacy Consult for Gentamicin Indication: Chorioamnionitis   Allergies  Allergen Reactions   Ciprofloxacin Hcl Nausea Only   Nitrofurantoin Other (See Comments)    Other reaction(s): Vomiting Other reaction(s): Vomiting    Patient Measurements: Height:  (170.2 cm) Weight: 104.5 kg (230 lb 4.8 oz) IBW/kg (Calculated) : 61.6 Adjusted Body Weight: 78.8 kg   Vital Signs: Temp: 102 F (38.9 C) (04/16 1839) Temp Source: Axillary (04/16 1839) BP: 153/97 (04/16 1900) Pulse Rate: 104 (04/16 1900)  Labs: Recent Labs    08/25/22 1726 08/26/22 0031 08/26/22 0657  WBC 10.0 11.5* 11.2*  HGB 12.8 12.9 12.1  PLT 211 218 165  CREATININE 0.83  --   --    No results for input(s): "GENTTROUGH", "GENTPEAK", "GENTRANDOM" in the last 72 hours.   Microbiology: Recent Results (from the past 720 hour(s))  OB RESULT CONSOLE Group B Strep     Status: None   Collection Time: 08/13/22 12:00 AM  Result Value Ref Range Status   GBS Negative  Final  Culture, OB Urine     Status: Abnormal   Collection Time: 08/15/22  4:35 PM   Specimen: Urine, Clean Catch  Result Value Ref Range Status   Specimen Description URINE, CLEAN CATCH  Final   Special Requests NONE  Final   Culture (A)  Final    MULTIPLE SPECIES PRESENT, SUGGEST RECOLLECTION NO GROUP B STREP (S.AGALACTIAE) ISOLATED Performed at [facility], [address] [place], [place] [address]    Report Status 08/16/2022 FINAL  Final    Medications:  Amp 2000 mg every 6 hours (4/16>>  Gent 390 mg every 24 hours (4/26>>   Assessment: 32 y.o. female G1P0 at [redacted]w[redacted]d presenting from the office for induction of labor secondary to worsening chronic hypertension. She has been febrile up to 102 degrees farenheit so amp/gent have been initiated for chorioamnionitis.   Goal of Therapy:  Gentamicin peak 6-8 mg/L and Trough < 1 mg/L  Plan:  Gentamicin 390 mg IV every 24 hrs  Check Scr with next  labs if gentamicin continued. Will check gentamicin levels if continued > 72hr or clinically indicated.  Janey Greaser 08/26/2022,7:05 PM

## 2022-08-26 NOTE — Anesthesia Preprocedure Evaluation (Signed)
Anesthesia Evaluation  Patient identified by MRN, date of birth, ID band Patient awake    Reviewed: Allergy & Precautions, Patient's Chart, lab work & pertinent test results  History of Anesthesia Complications Negative for: history of anesthetic complications  Airway Mallampati: II  TM Distance: >3 FB Neck ROM: Full    Dental no notable dental hx.    Pulmonary neg pulmonary ROS   Pulmonary exam normal        Cardiovascular hypertension, Pt. on medications Normal cardiovascular exam     Neuro/Psych negative neurological ROS     GI/Hepatic Neg liver ROS,GERD  ,,  Endo/Other  negative endocrine ROS    Renal/GU negative Renal ROS  negative genitourinary   Musculoskeletal negative musculoskeletal ROS (+)    Abdominal   Peds  Hematology negative hematology ROS (+)   Anesthesia Other Findings Hx of angioedema  Reproductive/Obstetrics (+) Pregnancy                             Anesthesia Physical Anesthesia Plan  ASA: 2  Anesthesia Plan: Epidural   Post-op Pain Management:    Induction:   PONV Risk Score and Plan: Treatment may vary due to age or medical condition  Airway Management Planned: Natural Airway  Additional Equipment: Fetal Monitoring  Intra-op Plan:   Post-operative Plan:   Informed Consent: I have reviewed the patients History and Physical, chart, labs and discussed the procedure including the risks, benefits and alternatives for the proposed anesthesia with the patient or authorized representative who has indicated his/her understanding and acceptance.       Plan Discussed with:   Anesthesia Plan Comments:        Anesthesia Quick Evaluation

## 2022-08-26 NOTE — Anesthesia Procedure Notes (Signed)
Epidural Patient location during procedure: OB Start time: 08/26/2022 8:50 AM End time: 08/26/2022 8:53 AM  Staffing Anesthesiologist: Kaylyn Layer, MD Performed: anesthesiologist   Preanesthetic Checklist Completed: patient identified, IV checked, risks and benefits discussed, monitors and equipment checked, pre-op evaluation and timeout performed  Epidural Patient position: sitting Prep: DuraPrep and site prepped and draped Patient monitoring: continuous pulse ox, blood pressure and heart rate Approach: midline Location: L3-L4 Injection technique: LOR air  Needle:  Needle type: Tuohy  Needle gauge: 17 G Needle length: 9 cm Needle insertion depth: 6 cm Catheter type: closed end flexible Catheter size: 19 Gauge Catheter at skin depth: 11 cm Test dose: negative and Other (1% lidocaine)  Assessment Events: blood not aspirated, no cerebrospinal fluid, injection not painful, no injection resistance, no paresthesia and negative IV test  Additional Notes Patient identified. Risks, benefits, and alternatives discussed with patient including but not limited to bleeding, infection, nerve damage, paralysis, failed block, incomplete pain control, headache, blood pressure changes, nausea, vomiting, reactions to medication, itching, and postpartum back pain. Confirmed with bedside nurse the patient's most recent platelet count. Confirmed with patient that they are not currently taking any anticoagulation, have any bleeding history, or any family history of bleeding disorders. Patient expressed understanding and wished to proceed. All questions were answered. Sterile technique was used throughout the entire procedure. Please see nursing notes for vital signs.   Crisp LOR on first pass. Test dose was given through epidural catheter and negative prior to continuing to dose epidural or start infusion. Warning signs of high block given to the patient including shortness of breath,  tingling/numbness in hands, complete motor block, or any concerning symptoms with instructions to call for help. Patient was given instructions on fall risk and not to get out of bed. All questions and concerns addressed with instructions to call with any issues or inadequate analgesia.  Reason for block:procedure for pain

## 2022-08-27 LAB — CBC
HCT: 31.6 % — ABNORMAL LOW (ref 36.0–46.0)
Hemoglobin: 10.7 g/dL — ABNORMAL LOW (ref 12.0–15.0)
MCH: 30.1 pg (ref 26.0–34.0)
MCHC: 33.9 g/dL (ref 30.0–36.0)
MCV: 88.8 fL (ref 80.0–100.0)
Platelets: 174 10*3/uL (ref 150–400)
RBC: 3.56 MIL/uL — ABNORMAL LOW (ref 3.87–5.11)
RDW: 13.2 % (ref 11.5–15.5)
WBC: 13.6 10*3/uL — ABNORMAL HIGH (ref 4.0–10.5)
nRBC: 0 % (ref 0.0–0.2)

## 2022-08-27 NOTE — Anesthesia Postprocedure Evaluation (Signed)
Anesthesia Post Note  Patient: Kimberly Larson  Procedure(s) Performed: AN AD HOC LABOR EPIDURAL     Patient location during evaluation: Mother Baby Anesthesia Type: Epidural Level of consciousness: awake and alert Pain management: pain level controlled Vital Signs Assessment: post-procedure vital signs reviewed and stable Respiratory status: spontaneous breathing, nonlabored ventilation and respiratory function stable Cardiovascular status: stable Postop Assessment: no headache, no backache and epidural receding Anesthetic complications: no  No notable events documented.  Last Vitals:  Vitals:   08/27/22 1946 08/27/22 2051  BP: (!) 149/97 137/83  Pulse: 89   Resp: 18   Temp: 36.5 C   SpO2: 97%     Last Pain:  Vitals:   08/27/22 2051  TempSrc:   PainSc: 4    Pain Goal:                   Kimberly Larson

## 2022-08-27 NOTE — Progress Notes (Signed)
Postpartum Progress Note  Post Partum Day 1 s/p spontaneous vaginal delivery.  Patient reports well-controlled pain, ambulating without difficulty, voiding spontaneously, tolerating PO.  Vaginal bleeding is appropriate.  Denies headache, vision change RUQ or epigastric pain.    Objective: Blood pressure (!) 135/95, pulse 89, temperature 98.9 F (37.2 C), temperature source Oral, resp. rate 18, height  (1.702 m), weight 104.5 kg, SpO2 98 %, unknown if currently breastfeeding.  Physical Exam:  General: alert and no distress Lochia: appropriate Uterine Fundus: firm DVT Evaluation: No evidence of DVT seen on physical exam.  Recent Labs    08/26/22 2034 08/27/22 0433  HGB 11.7* 10.7*  HCT 33.9* 31.6*    Assessment/Plan: Postpartum Day 1, s/p vaginal delivery. Chorioamnionitis at time of delivery Afebrile since delivery WBC 13 this AM S/p amp/gent No clinical signs of endometritis Gestational hypertension Procardia XL  AM and 30 mg PM BP mild range overnight Continue routine postpartum care Lactation following Anticipate discharge home tomorrow   LOS: 2 days   Lyn Henri 08/27/2022, 8:06 AM

## 2022-08-27 NOTE — Lactation Note (Signed)
This note was copied from a baby's chart. Lactation Consultation Note  Patient Name: Girl Cyntha Brickman [id number]'[id number] Date: 08/27/2022 Age:31 hours  Attempted to see mom but she was sleeping. Had needed assistance earlier and LC unavailable.   Maternal Data    Feeding    LATCH Score                    Lactation Tools Discussed/Used    Interventions    Discharge    Consult Status      Charyl Dancer 08/27/2022, 12:53 AM

## 2022-08-27 NOTE — Lactation Note (Signed)
This note was copied from a baby's chart. Lactation Consultation Note  Patient Name: Kimberly Larson Date: 08/27/2022 Age:31 hours  Reason for consult: Initial assessment;1st time breastfeeding;Primapara;Early term 37-38.6wks;Breastfeeding assistance;Mother's request;Difficult latch  P1, GA [redacted]w[redacted]d, 3% weight loss  Mother states she is having difficulty latching baby Kimberly "Kimberly Larson". She reports the last attempt went better. Mother's right nipple remains evert at rest and left nipple is flat and she has not been able to latch baby to that breast. Hand expression taught and mother was able to express colostrum, more from the left. Mother wanted assist with latching on the left. [name] is still sleepy but showed some cues. Basic breastfeeding education. Baby made progress with obtaining a deeper latch and was able to sustain the latch for 5-6 minutes. 20 mm nipple shield was applied but she was keeping it shallow in her mouth causing the nipple shield to go in and out. Baby became sleepy, mother feeling tired and hungry so the feeding session ended.   Mother has requested to pump. Will return after mother has ate and rested to educate mother on pumping.  Mom made aware of O/P services, breastfeeding support groups, and our phone # for post-discharge questions.     Maternal Data Has patient been taught Hand Expression?: Yes Does the patient have breastfeeding experience prior to this delivery?: No  Feeding Mother's Current Feeding Choice: Breast Milk  LATCH Score Latch: Repeated attempts needed to sustain latch, nipple held in mouth throughout feeding, stimulation needed to elicit sucking reflex.  Audible Swallowing: A few with stimulation  Type of Nipple: Everted at rest and after stimulation  Comfort (Breast/Nipple): Filling, red/small blisters or bruises, mild/mod discomfort  Hold (Positioning): Assistance needed to correctly position infant at breast and maintain latch.  LATCH  Score: 6   Lactation Tools Discussed/Used Tools: Nipple Shields  Interventions Interventions: Breast feeding basics reviewed;Assisted with latch;Skin to skin;Breast massage;Hand express;Breast compression;Adjust position;Support pillows;Position options;Education;LC Services brochure  Discharge Pump: Personal  Consult Status Consult Status: Follow-up Date: 08/28/22 Follow-up type: In-patient    Christella Hartigan M 08/27/2022, 1:33 PM

## 2022-08-27 NOTE — Lactation Note (Signed)
This note was copied from a baby's chart. Lactation Consultation Note  Patient Name: Kimberly Larson [id number] Date: 08/27/2022 Age:31 hours    Talked with mother's nurse. Mother was in the shower and then was going to take a nap. RN will set up DEBP later for mother.  Christella Hartigan M 08/27/2022, 7:43 PM

## 2022-08-28 NOTE — Discharge Instructions (Signed)
WHAT TO LOOK OUT FOR: Fever of 100.4 or above Mastitis: feels like flu and breasts hurt Infection: increased pain, swelling or redness Blood clots golf ball size or larger Postpartum depression   Congratulations on your newest addition! 

## 2022-08-28 NOTE — Plan of Care (Signed)
Problem: Education: Goal: Knowledge of Childbirth will improve 08/28/2022 0842 by Donne Hazel, LPN Outcome: Adequate for Discharge 08/28/2022 0813 by Donne Hazel, LPN Outcome: Progressing Goal: Ability to make informed decisions regarding treatment and plan of care will improve 08/28/2022 0842 by Donne Hazel, LPN Outcome: Adequate for Discharge 08/28/2022 0813 by Donne Hazel, LPN Outcome: Progressing Goal: Ability to state and carry out methods to decrease the pain will improve 08/28/2022 0842 by Donne Hazel, LPN Outcome: Adequate for Discharge 08/28/2022 0813 by Donne Hazel, LPN Outcome: Progressing Goal: Individualized Educational Video(s) 08/28/2022 0842 by Donne Hazel, LPN Outcome: Adequate for Discharge 08/28/2022 0813 by Donne Hazel, LPN Outcome: Progressing   Problem: Coping: Goal: Ability to verbalize concerns and feelings about labor and delivery will improve 08/28/2022 0842 by Donne Hazel, LPN Outcome: Adequate for Discharge 08/28/2022 0813 by Donne Hazel, LPN Outcome: Progressing   Problem: Life Cycle: Goal: Ability to make normal progression through stages of labor will improve 08/28/2022 0842 by Donne Hazel, LPN Outcome: Adequate for Discharge 08/28/2022 0813 by Donne Hazel, LPN Outcome: Progressing Goal: Ability to effectively push during vaginal delivery will improve 08/28/2022 0842 by Donne Hazel, LPN Outcome: Adequate for Discharge 08/28/2022 0813 by Donne Hazel, LPN Outcome: Progressing   Problem: Role Relationship: Goal: Will demonstrate positive interactions with the child 08/28/2022 0842 by Donne Hazel, LPN Outcome: Adequate for Discharge 08/28/2022 0813 by Donne Hazel, LPN Outcome: Progressing   Problem: Safety: Goal: Risk of complications during labor and delivery will decrease 08/28/2022 0842 by Donne Hazel, LPN Outcome: Adequate for Discharge 08/28/2022 0813 by Donne Hazel, LPN Outcome: Progressing    Problem: Pain Management: Goal: Relief or control of pain from uterine contractions will improve 08/28/2022 0842 by Donne Hazel, LPN Outcome: Adequate for Discharge 08/28/2022 0813 by Donne Hazel, LPN Outcome: Progressing   Problem: Education: Goal: Knowledge of General Education information will improve Description: Including pain rating scale, medication(s)/side effects and non-pharmacologic comfort measures 08/28/2022 0842 by Donne Hazel, LPN Outcome: Adequate for Discharge 08/28/2022 0813 by Donne Hazel, LPN Outcome: Progressing   Problem: Health Behavior/Discharge Planning: Goal: Ability to manage health-related needs will improve 08/28/2022 0842 by Donne Hazel, LPN Outcome: Adequate for Discharge 08/28/2022 0813 by Donne Hazel, LPN Outcome: Progressing   Problem: Clinical Measurements: Goal: Ability to maintain clinical measurements within normal limits will improve 08/28/2022 0842 by Donne Hazel, LPN Outcome: Adequate for Discharge 08/28/2022 0813 by Donne Hazel, LPN Outcome: Progressing Goal: Will remain free from infection 08/28/2022 0842 by Donne Hazel, LPN Outcome: Adequate for Discharge 08/28/2022 0813 by Donne Hazel, LPN Outcome: Progressing Goal: Diagnostic test results will improve 08/28/2022 0842 by Donne Hazel, LPN Outcome: Adequate for Discharge 08/28/2022 0813 by Donne Hazel, LPN Outcome: Progressing Goal: Respiratory complications will improve 08/28/2022 0842 by Donne Hazel, LPN Outcome: Adequate for Discharge 08/28/2022 0813 by Donne Hazel, LPN Outcome: Progressing Goal: Cardiovascular complication will be avoided 08/28/2022 0842 by Donne Hazel, LPN Outcome: Adequate for Discharge 08/28/2022 0813 by Donne Hazel, LPN Outcome: Progressing   Problem: Activity: Goal: Risk for activity intolerance will decrease 08/28/2022 0842 by Donne Hazel, LPN Outcome: Adequate for Discharge 08/28/2022 0813 by Donne Hazel,  LPN Outcome: Progressing   Problem: Nutrition: Goal: Adequate nutrition will be maintained 08/28/2022 0842 by Donne Hazel, LPN Outcome: Adequate for Discharge 08/28/2022 0813 by  Adam Phenix A, LPN Outcome: Progressing   Problem: Coping: Goal: Level of anxiety will decrease 08/28/2022 0842 by Donne Hazel, LPN Outcome: Adequate for Discharge 08/28/2022 0813 by Donne Hazel, LPN Outcome: Progressing   Problem: Elimination: Goal: Will not experience complications related to bowel motility 08/28/2022 0842 by Donne Hazel, LPN Outcome: Adequate for Discharge 08/28/2022 0813 by Donne Hazel, LPN Outcome: Progressing Goal: Will not experience complications related to urinary retention 08/28/2022 0842 by Donne Hazel, LPN Outcome: Adequate for Discharge 08/28/2022 0813 by Donne Hazel, LPN Outcome: Progressing   Problem: Pain Managment: Goal: General experience of comfort will improve 08/28/2022 0842 by Donne Hazel, LPN Outcome: Adequate for Discharge 08/28/2022 0813 by Donne Hazel, LPN Outcome: Progressing   Problem: Safety: Goal: Ability to remain free from injury will improve 08/28/2022 0842 by Donne Hazel, LPN Outcome: Adequate for Discharge 08/28/2022 0813 by Donne Hazel, LPN Outcome: Progressing   Problem: Skin Integrity: Goal: Risk for impaired skin integrity will decrease 08/28/2022 0842 by Donne Hazel, LPN Outcome: Adequate for Discharge 08/28/2022 0813 by Donne Hazel, LPN Outcome: Progressing   Problem: Education: Goal: Knowledge of condition will improve 08/28/2022 0842 by Donne Hazel, LPN Outcome: Adequate for Discharge 08/28/2022 0813 by Donne Hazel, LPN Outcome: Progressing Goal: Individualized Educational Video(s) 08/28/2022 0842 by Donne Hazel, LPN Outcome: Adequate for Discharge 08/28/2022 0813 by Donne Hazel, LPN Outcome: Progressing Goal: Individualized Newborn Educational Video(s) 08/28/2022 0842 by Donne Hazel,  LPN Outcome: Adequate for Discharge 08/28/2022 0813 by Donne Hazel, LPN Outcome: Progressing   Problem: Activity: Goal: Will verbalize the importance of balancing activity with adequate rest periods 08/28/2022 0842 by Donne Hazel, LPN Outcome: Adequate for Discharge 08/28/2022 0813 by Donne Hazel, LPN Outcome: Progressing Goal: Ability to tolerate increased activity will improve 08/28/2022 0842 by Donne Hazel, LPN Outcome: Adequate for Discharge 08/28/2022 0813 by Donne Hazel, LPN Outcome: Progressing   Problem: Coping: Goal: Ability to identify and utilize available resources and services will improve 08/28/2022 0842 by Donne Hazel, LPN Outcome: Adequate for Discharge 08/28/2022 0813 by Donne Hazel, LPN Outcome: Progressing   Problem: Life Cycle: Goal: Chance of risk for complications during the postpartum period will decrease 08/28/2022 0842 by Donne Hazel, LPN Outcome: Adequate for Discharge 08/28/2022 0813 by Donne Hazel, LPN Outcome: Progressing   Problem: Role Relationship: Goal: Ability to demonstrate positive interaction with newborn will improve 08/28/2022 0842 by Donne Hazel, LPN Outcome: Adequate for Discharge 08/28/2022 0813 by Donne Hazel, LPN Outcome: Progressing   Problem: Skin Integrity: Goal: Demonstration of wound healing without infection will improve 08/28/2022 0842 by Donne Hazel, LPN Outcome: Adequate for Discharge 08/28/2022 0813 by Donne Hazel, LPN Outcome: Progressing

## 2022-08-28 NOTE — Lactation Note (Signed)
This note was copied from a baby's chart. Lactation Consultation Note  Patient Name: Kimberly Larson [id number] Date: 08/28/2022 Age:31 hours Reason for consult: Nipple pain/trauma;Follow-up assessment;Infant weight loss;Difficult latch;Primapara;1st time breastfeeding;Early term 37-38.6wks (7 % weight loss, per mom had to feed the baby due to being to sore to latch and has been pumping with #21 F. was using a #20 NS. on B/P meds) LC stressed the importance of consistent pumping around the clock both breast for 15 -20 mins whether the baby is latching or not due to sore nipples and use of the Nipple shield.  LC reviewed the BF D/C teaching and LC resources.  LC recommended and LC O/P appt and  mom plans to check with the [facility] O/P at the [facility] [place] office 1st and if not available will check with [facility].    Maternal Data    Feeding Mother's Current Feeding Choice: Breast Milk  LATCH Score                    Lactation Tools Discussed/Used Tools: Shells;Pump;Flanges;Nipple Shields Nipple shield size: 20;24;Other (comment) (per mom the #24 NS to big , #20 NS fits ok and the baby is getting the hang of it) Flange Size: 21 Breast pump type: Double-Electric Breast Pump Pump Education: Milk Storage;Other (comment) (mom has been pumping)  Interventions Interventions: Breast feeding basics reviewed;Education;Hand pump;DEBP;LC Services brochure  Discharge Discharge Education: Engorgement and breast care;Warning signs for feeding baby;Outpatient recommendation;Other (comment) (per mom [facility] office has LC and will check with her , Mom aware the [facility] does too if needed) Pump: Personal;DEBP;Manual WIC Program: No  Consult Status Consult Status: Complete Date: 08/28/22    Kathrin Greathouse 08/28/2022, 2:37 PM

## 2022-08-28 NOTE — Discharge Summary (Signed)
Postpartum Discharge Summary  Date of Service August 28, 2022     Patient Name: Kimberly Larson DOB: [birth year] MRN: [id number]  Date of admission: 08/25/2022 Delivery date:08/26/2022  Delivering provider: Candice Camp  Date of discharge: 08/28/2022  Admitting diagnosis: Pregnancy induced hypertension [O13.9] Intrauterine pregnancy: [redacted]w[redacted]d     Secondary diagnosis:  Principal Problem:   Pregnancy induced hypertension  Additional problems: none    Discharge diagnosis: Term Pregnancy Delivered and CHTN with superimposed preeclampsia                                              Post partum procedures: not applicable Augmentation: AROM, Pitocin, and Cytotec Complications: Intrauterine Inflammation or infection (Chorioamniotis)  Hospital course: Induction of Labor With Vaginal Delivery   32 y.o. yo G1P1001 at [redacted]w[redacted]d was admitted to the hospital 08/25/2022 for induction of labor.  Indication for induction: Preeclampsia.  Patient had an labor course complicated by chorioamnionitis Membrane Rupture Time/Date: 10:12 AM ,08/26/2022   Delivery Method:Vaginal, Spontaneous  Episiotomy: None  Lacerations:  2nd degree  Details of delivery can be found in separate delivery note.  Patient had a postpartum course complicated bynothing . Patient is discharged home 08/28/22.  Newborn Data: Birth date:08/26/2022  Birth time:7:07 PM  Gender:Female  Living status:Living  Apgars:9 ,9  Weight:3500 g   Magnesium Sulfate received: No BMZ received: No Rhophylac:N/A MMR:N/A T-DaP:Given prenatally Flu: N/A Transfusion:No  Physical exam  Vitals:   08/27/22 1946 08/27/22 2051 08/27/22 2156 08/28/22 0624  BP: (!) 149/97 137/83  130/87  Pulse: 89   88  Resp: 18   17  Temp: 97.7 F (36.5 C)  97.8 F (36.6 C) 98.1 F (36.7 C)  TempSrc: Oral  Oral Oral  SpO2: 97%   98%  Weight:      Height:       General: alert, cooperative, and no distress Lochia: appropriate Uterine Fundus: firm Incision:  Healing well with no significant drainage DVT Evaluation: No evidence of DVT seen on physical exam. Labs: Lab Results  Component Value Date   WBC 13.6 (H) 08/27/2022   HGB 10.7 (L) 08/27/2022   HCT 31.6 (L) 08/27/2022   MCV 88.8 08/27/2022   PLT 174 08/27/2022      Latest Ref Rng & Units 08/25/2022    5:26 PM  CMP  Glucose 70 - 99 mg/dL 89   BUN 6 - 20 mg/dL 15   Creatinine 6.57 - 1.00 mg/dL 8.46   Sodium 962 - 952 mmol/L 133   Potassium 3.5 - 5.1 mmol/L 3.9   Chloride 98 - 111 mmol/L 103   CO2 22 - 32 mmol/L 20   Calcium 8.9 - 10.3 mg/dL 9.2   Total Protein 6.5 - 8.1 g/dL 6.1   Total Bilirubin 0.3 - 1.2 mg/dL 0.5   Alkaline Phos 38 - 126 U/L 99   AST 15 - 41 U/L 23   ALT 0 - 44 U/L 7    Edinburgh Score:    08/27/2022   11:00 AM  Edinburgh Postnatal Depression Scale Screening Tool  I have been able to laugh and see the funny side of things. 0  I have looked forward with enjoyment to things. 0  I have blamed myself unnecessarily when things went wrong. 0  I have been anxious or worried for no good reason. 1  I have  felt scared or panicky for no good reason. 0  Things have been getting on top of me. 0  I have been so unhappy that I have had difficulty sleeping. 0  I have felt sad or miserable. 0  I have been so unhappy that I have been crying. 0  The thought of harming myself has occurred to me. 0  Edinburgh Postnatal Depression Scale Total 1      After visit meds:  Allergies as of 08/28/2022       Reactions   Ciprofloxacin Hcl Nausea Only   Nitrofurantoin Other (See Comments)   Other reaction(s): Vomiting Other reaction(s): Vomiting        Medication List     STOP taking these medications    amphetamine-dextroamphetamine 10 MG tablet Commonly known as: ADDERALL   cyclobenzaprine 10 MG tablet Commonly known as: FLEXERIL   glycopyrrolate 1 MG tablet Commonly known as: Robinul   tretinoin 0.05 % cream Commonly known as: RETIN-A       TAKE  these medications    acetaminophen 500 MG tablet Commonly known as: TYLENOL Take 1,000 mg by mouth every 6 (six) hours as needed.   multivitamin-prenatal 27-0.8 MG Tabs tablet Take 1 tablet by mouth daily at 12 noon.   NIFEdipine 60 MG 24 hr tablet Commonly known as: PROCARDIA XL/NIFEDICAL XL Take 60 mg by mouth daily.   valACYclovir 500 MG tablet Commonly known as: VALTREX Take by mouth as needed.         Discharge home in stable condition Infant Feeding: Breast Infant Disposition:home with mother Discharge instruction: per After Visit Summary and Postpartum booklet. Activity: Advance as tolerated. Pelvic rest for 6 weeks.  Diet: routine diet Anticipated Birth Control: Unsure Postpartum Appointment:2 weeks Additional Postpartum F/U: bp check in 2 weeks  Future Appointments:No future appointments. Follow up Visit:      08/28/2022 Jeani Hawking, MD

## 2022-08-28 NOTE — Plan of Care (Signed)

## 2022-08-30 ENCOUNTER — Inpatient Hospital Stay (HOSPITAL_COMMUNITY)
Admission: AD | Admit: 2022-08-30 | Discharge: 2022-08-30 | Disposition: A | Discharge status: Home / Self Care | Payer: BC Managed Care – PPO | Attending: Obstetrics and Gynecology | Admitting: Obstetrics and Gynecology

## 2022-08-30 ENCOUNTER — Encounter (HOSPITAL_COMMUNITY): Payer: Self-pay | Admitting: Obstetrics and Gynecology

## 2022-08-30 DIAGNOSIS — O99893 Other specified diseases and conditions complicating puerperium: Secondary | ICD-10-CM | POA: Insufficient documentation

## 2022-08-30 DIAGNOSIS — R509 Fever, unspecified: Secondary | ICD-10-CM | POA: Insufficient documentation

## 2022-08-30 DIAGNOSIS — O165 Unspecified maternal hypertension, complicating the puerperium: Secondary | ICD-10-CM

## 2022-08-30 DIAGNOSIS — M7989 Other specified soft tissue disorders: Secondary | ICD-10-CM | POA: Diagnosis not present

## 2022-08-30 LAB — CBC WITH DIFFERENTIAL/PLATELET
Abs Immature Granulocytes: 0.17 10*3/uL — ABNORMAL HIGH (ref 0.00–0.07)
Basophils Absolute: 0.1 10*3/uL (ref 0.0–0.1)
Basophils Relative: 1 %
Eosinophils Absolute: 0.1 10*3/uL (ref 0.0–0.5)
Eosinophils Relative: 1 %
HCT: 35.4 % — ABNORMAL LOW (ref 36.0–46.0)
Hemoglobin: 11.7 g/dL — ABNORMAL LOW (ref 12.0–15.0)
Immature Granulocytes: 1 %
Lymphocytes Relative: 11 %
Lymphs Abs: 1.4 10*3/uL (ref 0.7–4.0)
MCH: 29.6 pg (ref 26.0–34.0)
MCHC: 33.1 g/dL (ref 30.0–36.0)
MCV: 89.6 fL (ref 80.0–100.0)
Monocytes Absolute: 0.7 10*3/uL (ref 0.1–1.0)
Monocytes Relative: 6 %
Neutro Abs: 10.2 10*3/uL — ABNORMAL HIGH (ref 1.7–7.7)
Neutrophils Relative %: 80 %
Platelets: 243 10*3/uL (ref 150–400)
RBC: 3.95 MIL/uL (ref 3.87–5.11)
RDW: 13.3 % (ref 11.5–15.5)
WBC: 12.6 10*3/uL — ABNORMAL HIGH (ref 4.0–10.5)
nRBC: 0 % (ref 0.0–0.2)

## 2022-08-30 MED ORDER — FUROSEMIDE 20 MG PO TABS: 20.0000 mg | ORAL_TABLET | Freq: Every day | ORAL | 0 refills | Status: DC

## 2022-08-30 NOTE — MAU Note (Signed)
Pt says she delivered vag on 08-26-22 D/C home on Thursday  Baby is at home Breast feeding At 2330- she woke from nap with  Temp - 102 Took XS 2 tabs Tyl- then temp was  100.7.  (She also had a fever during delivery - was undecided about sending home with antx- but Didn't )

## 2022-08-30 NOTE — MAU Provider Note (Signed)
History     CSN: [id number]  Arrival date and time: 08/30/22 0111   None     Chief Complaint  Patient presents with   Fever   Kimberly Larson is a 32 year old postpartum day 3 after SVD.  Was diagnosed with CORHIO during labor process and received amp and gent.  Reports that she went home and was doing well but spiked a fever today.  Reports that she has noticed her milk has been coming in and she has been having trouble with latching.  Reports that her fever got up to 102 F but she took Tylenol and fever resolved spontaneously.  Denies any abnormal drainage from the breast or point tenderness.  Has some abdominal pain but this has been consistent since delivery.  Is having vaginal bleeding with small clots but nothing golf ball size or larger.  Denies any upper respiratory symptoms since delivery but reports considerable URI symptoms prior to delivery.    OB History     Gravida  1   Para  1   Term  1   Preterm      AB      Living  1      SAB      IAB      Ectopic      Multiple  0   Live Births  1           Past Medical History:  Diagnosis Date   ADD (attention deficit disorder)    Angio-edema    Chronic sinus infection    GERD (gastroesophageal reflux disease)    Iron deficiency    Irritable bowel syndrome    Personal history of urinary (tract) infection    Pregnancy induced hypertension    Raynaud's disease    Seasonal allergies    Urinary frequency 05/15/2011    Past Surgical History:  Procedure Laterality Date   COLONOSCOPY     CYSTOSCOPY     DRUG INDUCED ENDOSCOPY     WISDOM TOOTH EXTRACTION      Family History  Problem Relation Age of Onset   Hypertension Mother    Luiz Blare' disease Mother    Crohn's disease Mother    Irritable bowel syndrome Mother    Other Father        trigeminal neuralgia   Asthma Sister    Heart disease Maternal Grandmother    Irritable bowel syndrome Maternal Grandmother    Heart disease Maternal Grandfather     Irritable bowel syndrome Maternal Grandfather     Social History   Tobacco Use   Smoking status: Never   Smokeless tobacco: Never  Vaping Use   Vaping Use: Never used  Substance Use Topics   Alcohol use: Not Currently    Alcohol/week: 4.0 standard drinks of alcohol    Types: 4 Cans of beer per week    Comment: some weeks   Drug use: No    Allergies:  Allergies  Allergen Reactions   Ciprofloxacin Hcl Nausea Only   Nitrofurantoin Other (See Comments)    Other reaction(s): Vomiting Other reaction(s): Vomiting    Medications Prior to Admission  Medication Sig Dispense Refill Last Dose   acetaminophen (TYLENOL) 500 MG tablet Take 1,000 mg by mouth every 6 (six) hours as needed.   08/29/2022   NIFEdipine (PROCARDIA XL/NIFEDICAL XL) 60 MG 24 hr tablet Take 60 mg by mouth daily.   08/29/2022 at 0800   NIFEdipine (PROCARDIA-XL/NIFEDICAL-XL) 30 MG 24 hr tablet Take 30 mg by  mouth daily.   08/29/2022 at 2045   Prenatal Vit-Fe Fumarate-FA (MULTIVITAMIN-PRENATAL) 27-0.8 MG TABS tablet Take 1 tablet by mouth daily at 12 noon.   08/29/2022   valACYclovir (VALTREX) 500 MG tablet Take by mouth as needed.   More than a month    Review of Systems  Constitutional:  Positive for fever. Negative for appetite change and fatigue.  HENT:  Negative for congestion.   Eyes:  Negative for visual disturbance.  Respiratory:  Negative for shortness of breath.   Cardiovascular:  Negative for chest pain.  Gastrointestinal:  Positive for abdominal pain. Negative for diarrhea and nausea.  Endocrine: Negative for polyuria.  Genitourinary:  Positive for vaginal bleeding. Negative for decreased urine volume, dysuria, urgency and vaginal pain.  Musculoskeletal:  Negative for arthralgias.  Neurological:  Negative for headaches.   Physical Exam   Blood pressure 137/86, pulse 90, temperature 98.3 F (36.8 C), resp. rate 16, height  (1.702 m), weight 97.4 kg, unknown if currently breastfeeding.  Physical  Exam Vitals reviewed.  Constitutional:      Appearance: Normal appearance.  HENT:     Head: Normocephalic.     Nose: Nose normal.     Mouth/Throat:     Mouth: Mucous membranes are moist.  Eyes:     Extraocular Movements: Extraocular movements intact.  Cardiovascular:     Rate and Rhythm: Normal rate.  Pulmonary:     Effort: Pulmonary effort is normal.  Chest:  Breasts:    Right: Swelling (With milk supply, not isolated swelling) present.     Left: Swelling present.  Abdominal:     General: Abdomen is flat. There is no distension.     Tenderness: There is no abdominal tenderness. There is no guarding or rebound.  Musculoskeletal:        General: Normal range of motion.     Cervical back: Normal range of motion.     Right lower leg: Edema present.     Left lower leg: Edema present.  Skin:    General: Skin is warm.     Findings: No bruising.  Neurological:     General: No focal deficit present.     Mental Status: She is alert.  Psychiatric:        Mood and Affect: Mood normal.     MAU Course  Procedures  MDM CBC Physical exam   Assessment and Plan  Kimberly Larson is a 32 yo G1P1 PPD#3 SVD w/ chorio s/p tx presenting with fever which resolved after tylenol at home.   Fever  Likely related to milk supply coming in with minimal breast engorgement. Does not appear to be mastitis. No exquisit tenderness in the abdomin. Not abnormal vaginal discharge.  CBC stable from prior evaluations.  Recommended continued monitoring and Tylenol as needed.  Discussed pumping and strict return precautions.  Lower extremity swelling Considerable lower extremity swelling bilaterally.  Will discharge home with Lasix 20 mg daily for 5 days.  BP mildly elevated.  Celedonio Savage 08/30/2022, 2:41 AM

## 2022-08-30 NOTE — Discharge Instructions (Signed)
During the first 48hrs of milk coming in, please ice prior to nursing/pumping, then massage with oiled hands - do this before each pumping/nursing session to help reduce swelling and allow milk to flow.  Start taking lasix in the morning (it will not decrease your milk supply at this low of a dose, it will help your milk flow since the swelling will be reduced).

## 2022-09-08 ENCOUNTER — Telehealth (HOSPITAL_COMMUNITY): Payer: Self-pay | Admitting: *Deleted

## 2022-09-08 NOTE — Telephone Encounter (Signed)
Mom reports feeling good. No concerns about herself at this time. EPDS not completed, but mom reports feeling well emotionally has good support. [facility] score=1) Mom reports baby is doing well. Feeding, peeing, and pooping without difficulty. Safe sleep reviewed. Mom reports no concerns about baby at present.  Duffy Rhody, RN 09-08-2022 at 2:49pm

## 2023-06-06 ENCOUNTER — Other Ambulatory Visit: Payer: Self-pay

## 2023-06-06 ENCOUNTER — Emergency Department (HOSPITAL_BASED_OUTPATIENT_CLINIC_OR_DEPARTMENT_OTHER): Payer: 59 | Admitting: Radiology

## 2023-06-06 ENCOUNTER — Emergency Department (HOSPITAL_BASED_OUTPATIENT_CLINIC_OR_DEPARTMENT_OTHER)
Admission: EM | Admit: 2023-06-06 | Discharge: 2023-06-06 | Disposition: A | Discharge status: Home / Self Care | Payer: 59 | Attending: Emergency Medicine | Admitting: Emergency Medicine

## 2023-06-06 DIAGNOSIS — R079 Chest pain, unspecified: Secondary | ICD-10-CM | POA: Diagnosis present

## 2023-06-06 LAB — BASIC METABOLIC PANEL
Anion gap: 10 (ref 5–15)
BUN: 15 mg/dL (ref 6–20)
CO2: 22 mmol/L (ref 22–32)
Calcium: 8.7 mg/dL — ABNORMAL LOW (ref 8.9–10.3)
Chloride: 108 mmol/L (ref 98–111)
Creatinine, Ser: 1.05 mg/dL — ABNORMAL HIGH (ref 0.44–1.00)
GFR, Estimated: >60 mL/min (ref >60)
Glucose, Bld: 71 mg/dL (ref 70–99)
Potassium: 4 mmol/L (ref 3.5–5.1)
Sodium: 140 mmol/L (ref 135–145)

## 2023-06-06 LAB — URINALYSIS, ROUTINE W REFLEX MICROSCOPIC
Bacteria, UA: NONE SEEN
Bilirubin Urine: NEGATIVE
Glucose, UA: NEGATIVE mg/dL
Ketones, ur: NEGATIVE mg/dL
Leukocytes,Ua: NEGATIVE
Nitrite: NEGATIVE
Protein, ur: NEGATIVE mg/dL
RBC / HPF: >50 RBC/hpf (ref 0–5)
Specific Gravity, Urine: 1.018 (ref 1.005–1.030)
pH: 6.5 (ref 5.0–8.0)

## 2023-06-06 LAB — PREGNANCY, URINE: Preg Test, Ur: NEGATIVE

## 2023-06-06 LAB — CBC
HCT: 40.5 % (ref 36.0–46.0)
Hemoglobin: 13.8 g/dL (ref 12.0–15.0)
MCH: 28.9 pg (ref 26.0–34.0)
MCHC: 34.1 g/dL (ref 30.0–36.0)
MCV: 84.7 fL (ref 80.0–100.0)
Platelets: 239 10*3/uL (ref 150–400)
RBC: 4.78 MIL/uL (ref 3.87–5.11)
RDW: 13 % (ref 11.5–15.5)
WBC: 11 10*3/uL — ABNORMAL HIGH (ref 4.0–10.5)
nRBC: 0 % (ref 0.0–0.2)

## 2023-06-06 LAB — BRAIN NATRIURETIC PEPTIDE: B Natriuretic Peptide: 39.5 pg/mL (ref 0.0–100.0)

## 2023-06-06 LAB — TROPONIN I (HIGH SENSITIVITY)
Troponin I (High Sensitivity): <2 ng/L (ref <18)
Troponin I (High Sensitivity): 3 ng/L (ref <18)

## 2023-06-06 LAB — MAGNESIUM: Magnesium: 1.9 mg/dL (ref 1.7–2.4)

## 2023-06-06 MED ORDER — FAMOTIDINE 20 MG PO TABS
20.0000 mg | ORAL_TABLET | Freq: Once | ORAL | Status: AC
  Administered 2023-06-06: 20 mg via ORAL
  Filled 2023-06-06: qty 1

## 2023-06-06 NOTE — Discharge Instructions (Addendum)
Incidental finding on XR to follow up with your PCP  Subtle nodular opacity projecting over the lateral upper right  lung, which may represent a pulmonary nodule. Recommend further  evaluation with non-emergent non-contrast chest CT.

## 2023-06-06 NOTE — ED Triage Notes (Addendum)
Seen at [facility] for CP-started 1600 today after eating. N/Vx2 today. Headache, dizziness. Found to be in bradycardia-46bpm. Personal watch recorded 38bpm at one point today. Normal 50's-60's.  Gave birth in April.

## 2023-06-06 NOTE — ED Provider Notes (Signed)
[place] EMERGENCY DEPARTMENT AT [facility] Provider Note   CSN: [id number] Arrival date & time: 06/06/23  1742     History No chief complaint on file.   HPI Kimberly Larson is a 33 y.o. female presenting for lower HR readings and some intermittent CP throughout the day. Followed with cardiology for bradycardia and only had arhythmia at that time. Denies fevers or chills, nausea or vomitting syncope shortness.  Patient's recorded medical, surgical, social, medication list and allergies were reviewed in the Snapshot window as part of the initial history.   Review of Systems   Review of Systems  Constitutional:  Negative for chills and fever.  HENT:  Negative for ear pain and sore throat.   Eyes:  Negative for pain and visual disturbance.  Respiratory:  Negative for cough and shortness of breath.   Cardiovascular:  Positive for chest pain and palpitations.  Gastrointestinal:  Negative for abdominal pain and vomiting.  Genitourinary:  Negative for dysuria and hematuria.  Musculoskeletal:  Negative for arthralgias and back pain.  Skin:  Negative for color change and rash.  Neurological:  Negative for seizures and syncope.  All other systems reviewed and are negative.   Physical Exam Updated Vital Signs BP 128/77   Pulse (!) 42   Temp (!) 97.3 F (36.3 C)   Resp 17   Wt 83.5 kg   SpO2 97%   BMI 28.82 kg/m  Physical Exam Vitals and nursing note reviewed.  Constitutional:      General: She is not in acute distress.    Appearance: She is well-developed.  HENT:     Head: Normocephalic and atraumatic.  Eyes:     Conjunctiva/sclera: Conjunctivae normal.  Cardiovascular:     Rate and Rhythm: Normal rate and regular rhythm.     Heart sounds: No murmur heard. Pulmonary:     Effort: Pulmonary effort is normal. No respiratory distress.     Breath sounds: Normal breath sounds.  Abdominal:     General: There is no distension.     Palpations: Abdomen is soft.      Tenderness: There is no abdominal tenderness. There is no right CVA tenderness or left CVA tenderness.  Musculoskeletal:        General: No swelling or tenderness. Normal range of motion.     Cervical back: Neck supple.  Skin:    General: Skin is warm and dry.  Neurological:     General: No focal deficit present.     Mental Status: She is alert and oriented to person, place, and time. Mental status is at baseline.     Cranial Nerves: No cranial nerve deficit.      ED Course/ Medical Decision Making/ A&P    Procedures Procedures   Medications Ordered in ED Medications  famotidine (PEPCID) tablet 20 mg (20 mg Oral Given 06/06/23 1906)   Medical Decision Making: Kimberly Larson is a 33 y.o. female who presented to the ED today with chest pain, detailed above.  Based on patient's comorbidities, patient has a heart score of 1.    Additional history discussed with patient's family/caregivers.  Patient placed on continuous vitals and telemetry monitoring while in ED which was reviewed periodically.  Complete initial physical exam performed, notably the patient was HDS in  NAD.   Reviewed and confirmed nursing documentation for past medical history, family history, social history.    Initial Assessment: With the patient's presentation of left-sided chest pain, most likely diagnosis is musculoskeletal  chest pain versus GERD, although ACS remains on the differential. Other diagnoses were considered including (but not limited to) pulmonary embolism, community-acquired pneumonia, aortic dissection, pneumothorax, underlying bony abnormality, anemia. These are considered less likely due to history of present illness and physical exam findings.    In particular, concerning pulmonary embolism: Patient is PERC NEGATIVE and the they deny malignancy, recent surgery, history of DVT, or calf tenderness leading to a low risk Wells score. Aortic Dissection also reconsidered but seems less likely based on the  location, quality, onset, and severity of symptoms in this case. Patient has a lack of serious comorbidities for this condition including a lack of HTN or Smoking. Patient also has a lack of underlying history of AD or TAA.  This is most consistent with an acute life/limb threatening illness complicated by underlying chronic conditions.   Initial Plan: Evaluate for ACS with delta troponin and EKG evaluated as below  Evaluate for dissection, bony abnormality, or pneumonia with chest x-ray and screening laboratory evaluation including CBC, BMP  Further evaluation for pulmonary embolism not indicated at this time based on patient's PERC and Wells score.  Further evaluation for Thoracic Aortic Dissection not indicated at this time based on patient's clinical history and PE findings.   Initial Study Results: EKG was reviewed independently. Rate, rhythm, axis, intervals all examined and without medically relevant abnormality. ST segments without concerns for elevations.    Laboratory  Delta  troponin demonstrated NAA   CBC and BMP without obvious metabolic or inflammatory abnormalities requiring further evaluation   Radiology  DG Chest Port 1 View Result Date: 06/06/2023 CLINICAL DATA:  Chest pain EXAM: PORTABLE CHEST 1 VIEW COMPARISON:  Chest radiograph dated 09/08/2019 FINDINGS: Normal lung volumes. Subtle nodular opacity projecting over the lateral upper right lung. Hazy left basilar opacity. No pleural effusion or pneumothorax. The heart size and mediastinal contours are within normal limits. No acute osseous abnormality. IMPRESSION: 1. Subtle nodular opacity projecting over the lateral upper right lung, which may represent a pulmonary nodule. Recommend further evaluation with non-emergent non-contrast chest CT. 2. Hazy left basilar opacity is likely artifactual related to superimposition of soft tissue structures. Electronically Signed   By: Agustin Cree M.D.   On: 06/06/2023 19:24    Final  Assessment and Plan: On repeat clinical assessment, patient has had complete symptomatic resolution after administration of pepcid.  They are currently ambulatory tolerating p.o. intake.  They deny fevers or chills, nausea vomiting, syncope shortness of breath.  Favor likely musculoskeletal versus gastroesophageal etiology of patient's symptoms.  Considered ACS grossly less likely given resolution, well appearance and negative troponin and low HEART score.  Patient to follow-up with primary care provider within 48 hours for ongoing care and management.  Disposition:  I have considered need for hospitalization, however, considering all of the above, I believe this patient is stable for discharge at this time.  Patient/family educated about specific return precautions for given chief complaint and symptoms.  Patient/family educated about follow-up with PCP and cardiology.     Patient explicitly told about PULMONARY NODULE incidental findings and importance of PCP follow up.   Patient/family expressed understanding of return precautions and need for follow-up. Patient spoken to regarding all imaging and laboratory results and appropriate follow up for these results. All education provided in verbal form with additional information in written form. Time was allowed for answering of patient questions. Patient discharged.    Emergency Department Medication Summary:   Medications  famotidine (PEPCID) tablet  20 mg (20 mg Oral Given 06/06/23 1906)           Clinical Impression:  1. Chest pain, unspecified type      Discharge   Final Clinical Impression(s) / ED Diagnoses Final diagnoses:  Chest pain, unspecified type    Rx / DC Orders ED Discharge Orders     None         Glyn Ade, MD 06/06/23 2101

## 2023-06-09 ENCOUNTER — Other Ambulatory Visit
Admission: RE | Admit: 2023-06-09 | Discharge: 2023-06-09 | Disposition: A | Discharge status: Home / Self Care | Payer: 59 | Source: Ambulatory Visit | Attending: Physician Assistant | Admitting: Physician Assistant

## 2023-06-09 ENCOUNTER — Ambulatory Visit: Payer: 59 | Attending: Physician Assistant | Admitting: Physician Assistant

## 2023-06-09 ENCOUNTER — Ambulatory Visit: Payer: 59

## 2023-06-09 VITALS — BP 118/80 | HR 54 | Ht 67.0 in | Wt 184.0 lb

## 2023-06-09 DIAGNOSIS — R072 Precordial pain: Secondary | ICD-10-CM | POA: Diagnosis not present

## 2023-06-09 DIAGNOSIS — R519 Headache, unspecified: Secondary | ICD-10-CM | POA: Diagnosis not present

## 2023-06-09 DIAGNOSIS — R001 Bradycardia, unspecified: Secondary | ICD-10-CM | POA: Diagnosis not present

## 2023-06-09 DIAGNOSIS — R079 Chest pain, unspecified: Secondary | ICD-10-CM

## 2023-06-09 LAB — TROPONIN I (HIGH SENSITIVITY): Troponin I (High Sensitivity): 3 ng/L (ref <18)

## 2023-06-09 NOTE — Patient Instructions (Addendum)
Medication Instructions:  No changes at this time.   *If you need a refill on your cardiac medications before your next appointment, please call your pharmacy*   Lab Work: Troponin STAT over at the [facility].  If you have labs (blood work) drawn today and your tests are completely normal, you will receive your results only by: MyChart Message (if you have MyChart) OR A paper copy in the mail If you have any lab test that is abnormal or we need to change your treatment, we will call you to review the results.   Testing/Procedures: Your physician has requested that you have an echocardiogram. Echocardiography is a painless test that uses sound waves to create images of your heart. It provides your doctor with information about the size and shape of your heart and how well your heart's chambers and valves are working. This procedure takes approximately one hour. There are no restrictions for this procedure. Please do NOT wear cologne, perfume, aftershave, or lotions (deodorant is allowed). Please arrive 15 minutes prior to your appointment time.  Please note: We ask at that you not bring children with you during ultrasound (echo/ vascular) testing. Due to room size and safety concerns, children are not allowed in the ultrasound rooms during exams. Our front office staff cannot provide observation of children in our lobby area while testing is being conducted. An adult accompanying a patient to their appointment will only be allowed in the ultrasound room at the discretion of the ultrasound technician under special circumstances. We apologize for any inconvenience.  Your physician has recommended that you wear a Zio monitor.   This monitor is a medical device that records the heart's electrical activity. Doctors most often use these monitors to diagnose arrhythmias. Arrhythmias are problems with the speed or rhythm of the heartbeat. The monitor is a small device applied to your chest. You  can wear one while you do your normal daily activities. While wearing this monitor if you have any symptoms to push the button and record what you felt. Once you have worn this monitor for the period of time provider prescribed (Usually 14 days), you will return the monitor device in the postage paid box. Once it is returned they will download the data collected and provide [place] with a report which the provider will then review and we will call you with those results. Important tips:  Avoid showering during the first 24 hours of wearing the monitor. Avoid excessive sweating to help maximize wear time. Do not submerge the device, no hot tubs, and no swimming pools. Keep any lotions or oils away from the patch. After 24 hours you may shower with the patch on. Take brief showers with your back facing the shower head.  Do not remove patch once it has been placed because that will interrupt data and decrease adhesive wear time. Push the button when you have any symptoms and write down what you were feeling. Once you have completed wearing your monitor, remove and place into box which has postage paid and place in your outgoing mailbox.  If for some reason you have misplaced your box then call our office and we can provide another box and/or mail it off for you.    [facility] MYOVIEW  Your Provider has ordered a Stress Test with nuclear imaging. The purpose of this test is to evaluate the blood supply to your heart muscle. This procedure is referred to as a "Non-Invasive Stress Test." This is because other  than having an IV started in your vein, nothing is inserted or "invades" your body. Cardiac stress tests are done to find areas of poor blood flow to the heart by determining the extent of coronary artery disease (CAD). Some patients exercise on a treadmill, which naturally increases the blood flow to your heart, while others who are unable to walk on a treadmill due to physical limitations have a  pharmacologic/chemical stress agent called Lexiscan. This medicine will mimic walking on a treadmill by temporarily increasing your coronary blood flow.     REPORT TO [facility] MEDICAL MALL ENTRANCE  **Proceed to the 1st desk on the right, REGISTRATION, to check in**  Please note: this test may take anywhere between 2-4 hours to complete    Instructions regarding medication:   _XX__:   You may take all of your regular morning medications the day of your test unless listed below.      How to prepare for your Myoview test:  Do not eat or drink for 6 hours prior to the test No caffeine for 24 hours prior to the test No smoking 24 hours prior to the test. Ladies, please do not wear dresses.  Skirts or pants are appropriate. Please wear a short sleeve shirt. No perfume, cologne or lotion. Wear comfortable walking shoes. No heels!   PLEASE NOTIFY THE OFFICE AT LEAST 24 HOURS IN ADVANCE IF YOU ARE UNABLE TO KEEP YOUR APPOINTMENT.  [phone] AND  PLEASE NOTIFY NUCLEAR MEDICINE AT [facility] AT LEAST 24 HOURS IN ADVANCE IF YOU ARE UNABLE TO KEEP YOUR APPOINTMENT. [phone]       Follow-Up: At [facility], you and your health needs are our priority.  As part of our continuing mission to provide you with exceptional heart care, we have created designated Provider Care Teams.  These Care Teams include your primary Cardiologist (physician) and Advanced Practice Providers (APPs -  Physician Assistants and Nurse Practitioners) who all work together to provide you with the care you need, when you need it.   Your next appointment:   2 month(s)  Provider:   Yvonne Kendall, MD

## 2023-06-09 NOTE — Progress Notes (Signed)
Cardiology Office Note    Date:  06/09/2023   ID:  Kimberly Larson, DOB Apr 03, 1991, MRN [id number]  PCP:  Adrian Prince, MD  Cardiologist:  Yvonne Kendall, MD  Electrophysiologist:  None   Chief Complaint: ED follow-up  History of Present Illness:   Kimberly Larson is a 33 y.o. female with history of HTN, ADD, pulmonary nodule, recurrent URIs and strep infections, IBS, and seasonal allergies who presents for ED follow-up.  Zio patch in 12/2018 showed a predominant rhythm of sinus with episodes of ectopic atrial rhythm and brief accelerated idioventricular rhythm and rare PACs and PVCs.  Echo in 01/2019 showed an EF of 55 to 60%, normal LV diastolic function parameters and RV systolic function.  Renal artery ultrasound in 01/2019 showed no evidence of renal artery stenosis.  She was most recently seen in the office in 05/2021 and was feeling fairly well at that time.  She continued to note her heart rate would sometimes dip into the 40s bpm when sleeping by her smart watch.  She also detected some low oxygen saturations into the 80s she had not been told by her husband that she snores or stops breathing.  Subsequent home sleep study showed occasional apneas and hypopneas.  She also continued to note paresthesias and numbness in her extremities feet.  Prior trial of amlodipine that did not provide improvement.  Since we last saw her, she became pregnant and delivered 08/2022 to be superimposed with preeclampsia.  She was seen in the ED on 06/06/2023 with intermittent chest pain throughout the day.  High-sensitivity troponin negative x 2.  BNP 39.  EKG showed sinus bradycardia with incomplete right bundle branch block, consistent with prior tracing.  Chest x-ray with subtle nodular opacity projecting over the lateral upper right lung possibly representative of a pulmonary nodule.  Symptoms were favored to be musculoskeletal versus GI etiology.  She comes in today noting a several day history of intermittent  headache, dizziness with sweating, episodic bradycardia, and chest discomfort.  She reports initially on 1/25, she dizziness with associated sweating followed by right-sided headache.  This was accompanied by chest discomfort that felt like a pulled muscle.  No significant trauma or exertional to her discomfort.  This prompted her to present to the ED with reassuring workup as outlined above.  Following this, she continued to note intermittent bradycardic rates, occurring both nocturnally and while awake.  No frank syncope.  She reports appropriate atrophic competence when she is working out on her treadmill, though she has not exercised since the above ED evaluation.  She has continued to have intermittent chest discomfort that is both along the upper outer anterior left side chest wall into the left shoulder, and different pain centrally.  Pain along the center of her chest is somewhat reproducible to her palpation.  No family history of premature CAD.   Labs independently reviewed: 05/2023 -magnesium 1.9, Hgb 13.8, PLT 239 potassium 4.0, BUN 15, serum creatinine 1.05 08/2022 -albumin 3.0, AST/ALT normal  Past Medical History:  Diagnosis Date   ADD (attention deficit disorder)    Angio-edema    Chronic sinus infection    GERD (gastroesophageal reflux disease)    Iron deficiency    Irritable bowel syndrome    Personal history of urinary (tract) infection    Pregnancy induced hypertension    Raynaud's disease    Seasonal allergies    Urinary frequency 05/15/2011    Past Surgical History:  Procedure Laterality Date  COLONOSCOPY     CYSTOSCOPY     DRUG INDUCED ENDOSCOPY     WISDOM TOOTH EXTRACTION      Current Medications: Current Meds  Medication Sig   ADDERALL 10 MG tablet 1 tablet Orally Twice a day for 30 days   valACYclovir (VALTREX) 500 MG tablet Take by mouth as needed.    Allergies:   Ciprofloxacin hcl and Nitrofurantoin   Social History   Socioeconomic History    Marital status: Married    Spouse name: Not on file   Number of children: Not on file   Years of education: Not on file   Highest education level: Not on file  Occupational History   Not on file  Tobacco Use   Smoking status: Never   Smokeless tobacco: Never  Vaping Use   Vaping status: Never Used  Substance and Sexual Activity   Alcohol use: Not Currently    Alcohol/week: 4.0 standard drinks of alcohol    Types: 4 Cans of beer per week    Comment: some weeks   Drug use: No   Sexual activity: Not Currently  Other Topics Concern   Not on file  Social History Narrative   Not on file   Social Drivers of Health   Financial Resource Strain: Not on file  Food Insecurity: No Food Insecurity (08/28/2022)   Hunger Vital Sign    Worried About Running Out of Food in the Last Year: Never true    Ran Out of Food in the Last Year: Never true  Transportation Needs: No Transportation Needs (08/28/2022)   PRAPARE - Administrator, Civil Service (Medical): No    Lack of Transportation (Non-Medical): No  Physical Activity: Not on file  Stress: Not on file  Social Connections: Not on file     Family History:  The patient's family history includes Asthma in her sister; Crohn's disease in her mother; Luiz Blare' disease in her mother; Heart disease in her maternal grandfather and maternal grandmother; Hypertension in her mother; Irritable bowel syndrome in her maternal grandfather, maternal grandmother, and mother; Other in her father.  ROS:   12-point review of systems is negative unless otherwise noted in the HPI.   EKGs/Labs/Other Studies Reviewed:    Studies reviewed were summarized above. The additional studies were reviewed today:  Zio patch 12/2018: The patient was monitored for 14 days. The predominant rhythm was sinus with an average rate of 71 bpm (range 37-197 bpm). Rare isolated PAC's and PVC's were observed. Episodes of ectopic atrial rhyhtm and brief accelerated  idioventricular rhythm were noted. Patient triggered events correspond mostly to sinus rhythm, though ectopic atrial rhythm is also present during some triggered events.   Predominantly sinus rhythm with rare PAC's and PVC's, as well as episodes of ectopic atrial rhythm and accelerated idioventricular rhythm. __________  2D echo 01/12/2019: 1. The left ventricle has normal systolic function, with an ejection  fraction of 55-60%. The cavity size was normal. Left ventricular diastolic  parameters were normal.   2. The right ventricle has normal systolic function. The cavity was  normal. There is no increase in right ventricular wall thickness.Unable to  estimate RVSP.  __________  Renal artery ultrasound 01/12/2019: Summary:  Largest Aortic Diameter: 1.7 cm    Renal:    Right: Normal size right kidney. Normal right Resisitive Index.         Normal cortical thickness of right kidney. No evidence of  right renal artery stenosis. RRV flow present.  Left:  LRV flow present. No evidence of left renal artery stenosis.         Normal size of left kidney. Normal left Resistive Index.         Normal cortical thickness of the left kidney.  Mesenteric:  Normal Celiac artery and Superior Mesenteric artery findings.    EKG:  EKG is ordered today.  The EKG ordered today demonstrates sinus bradycardia, 54 bpm, inferior T wave inversion  Recent Labs: 08/25/2022: ALT 7 06/06/2023: B Natriuretic Peptide 39.5; BUN 15; Creatinine, Ser 1.05; Hemoglobin 13.8; Magnesium 1.9; Platelets 239; Potassium 4.0; Sodium 140  Recent Lipid Panel No results found for: "CHOL", "TRIG", "HDL", "CHOLHDL", "VLDL", "LDLCALC", "LDLDIRECT"  PHYSICAL EXAM:    VS:  BP 118/80 (BP Location: Left Arm, Patient Position: Sitting, Cuff Size: Normal)   Pulse (!) 54   Ht 5\' 7"  (1.702 m)   Wt 184 lb (83.5 kg)   SpO2 98%   BMI 28.82 kg/m   BMI: Body mass index is 28.82 kg/m.  Physical Exam Vitals reviewed.   Constitutional:      Appearance: She is well-developed.  HENT:     Head: Normocephalic and atraumatic.  Eyes:     General:        Right eye: No discharge.        Left eye: No discharge.  Neck:     Vascular: No JVD.  Cardiovascular:     Rate and Rhythm: Normal rate and regular rhythm.     Heart sounds: Normal heart sounds, S1 normal and S2 normal. Heart sounds not distant. No midsystolic click and no opening snap. No murmur heard.    No friction rub.  Pulmonary:     Effort: Pulmonary effort is normal. No respiratory distress.     Breath sounds: Normal breath sounds. No decreased breath sounds, wheezing, rhonchi or rales.  Chest:     Chest wall: No tenderness.  Abdominal:     General: There is no distension.  Musculoskeletal:     Cervical back: Normal range of motion.  Skin:    General: Skin is warm and dry.     Nails: There is no clubbing.  Neurological:     Mental Status: She is alert and oriented to person, place, and time.  Psychiatric:        Speech: Speech normal.        Behavior: Behavior normal.        Thought Content: Thought content normal.        Judgment: Judgment normal.     Wt Readings from Last 3 Encounters:  06/09/23 184 lb (83.5 kg)  06/06/23 184 lb (83.5 kg)  08/30/22 214 lb 11.2 oz (97.4 kg)     ASSESSMENT & PLAN:   Chest pain: Obtain stat troponin in the medical mall this morning.  Plan for treadmill MPI given symptoms and baseline EKG with nonspecific abnormalities.  Update echo.  Sinus bradycardia: Asymptomatic.  No urgent indication for PPM.  Avoid beta-blocker and nondihydropyridine calcium channel blocker.  Prior outpatient cardiac monitoring has demonstrated a problem chronotropic competence.  Repeat Zio patch.  Treadmill as outlined above to assess chronotropic competence.  Headache: No red flag symptoms.  Management per PCP.  History of pulmonary nodule: Evaluated by pulmonology.   Informed Consent   Shared Decision Making/Informed  Consent{  The risks [chest pain, shortness of breath, cardiac arrhythmias, dizziness, blood pressure fluctuations, myocardial infarction, stroke/transient ischemic attack, nausea,  vomiting, allergic reaction, radiation exposure, metallic taste sensation and life-threatening complications (estimated to be 1 in 10,000)], benefits (risk stratification, diagnosing coronary artery disease, treatment guidance) and alternatives of a nuclear stress test were discussed in detail with Ms. Granquist and she agrees to proceed.       Disposition: F/u with Dr. Okey Dupre after cardiac testing.   Medication Adjustments/Labs and Tests Ordered: Current medicines are reviewed at length with the patient today.  Concerns regarding medicines are outlined above. Medication changes, Labs and Tests ordered today are summarized above and listed in the Patient Instructions accessible in Encounters.   Signed, Eula Listen, PA-C 06/09/2023 1:53 PM     [facility] [address] [place], [place] [address] [phone]

## 2023-06-11 ENCOUNTER — Emergency Department (HOSPITAL_BASED_OUTPATIENT_CLINIC_OR_DEPARTMENT_OTHER)
Admission: EM | Admit: 2023-06-11 | Discharge: 2023-06-12 | Disposition: A | Discharge status: Home / Self Care | Payer: 59 | Attending: Emergency Medicine | Admitting: Emergency Medicine

## 2023-06-11 ENCOUNTER — Encounter (HOSPITAL_BASED_OUTPATIENT_CLINIC_OR_DEPARTMENT_OTHER): Payer: Self-pay

## 2023-06-11 ENCOUNTER — Emergency Department (HOSPITAL_BASED_OUTPATIENT_CLINIC_OR_DEPARTMENT_OTHER): Payer: 59

## 2023-06-11 ENCOUNTER — Other Ambulatory Visit: Payer: Self-pay

## 2023-06-11 DIAGNOSIS — R0782 Intercostal pain: Secondary | ICD-10-CM | POA: Insufficient documentation

## 2023-06-11 DIAGNOSIS — R531 Weakness: Secondary | ICD-10-CM | POA: Insufficient documentation

## 2023-06-11 DIAGNOSIS — R202 Paresthesia of skin: Secondary | ICD-10-CM | POA: Insufficient documentation

## 2023-06-11 DIAGNOSIS — R072 Precordial pain: Secondary | ICD-10-CM

## 2023-06-11 LAB — COMPREHENSIVE METABOLIC PANEL
ALT: 6 U/L (ref 0–44)
AST: 14 U/L — ABNORMAL LOW (ref 15–41)
Albumin: 4.8 g/dL (ref 3.5–5.0)
Alkaline Phosphatase: 61 U/L (ref 38–126)
Anion gap: 10 (ref 5–15)
BUN: 21 mg/dL — ABNORMAL HIGH (ref 6–20)
CO2: 27 mmol/L (ref 22–32)
Calcium: 9.3 mg/dL (ref 8.9–10.3)
Chloride: 103 mmol/L (ref 98–111)
Creatinine, Ser: 1.04 mg/dL — ABNORMAL HIGH (ref 0.44–1.00)
GFR, Estimated: >60 mL/min (ref >60)
Glucose, Bld: 101 mg/dL — ABNORMAL HIGH (ref 70–99)
Potassium: 3.3 mmol/L — ABNORMAL LOW (ref 3.5–5.1)
Sodium: 140 mmol/L (ref 135–145)
Total Bilirubin: 0.6 mg/dL (ref 0.0–1.2)
Total Protein: 7.2 g/dL (ref 6.5–8.1)

## 2023-06-11 LAB — DIFFERENTIAL
Abs Immature Granulocytes: 0.01 10*3/uL (ref 0.00–0.07)
Basophils Absolute: 0 10*3/uL (ref 0.0–0.1)
Basophils Relative: 1 %
Eosinophils Absolute: 0.1 10*3/uL (ref 0.0–0.5)
Eosinophils Relative: 2 %
Immature Granulocytes: 0 %
Lymphocytes Relative: 40 %
Lymphs Abs: 3.3 10*3/uL (ref 0.7–4.0)
Monocytes Absolute: 0.5 10*3/uL (ref 0.1–1.0)
Monocytes Relative: 7 %
Neutro Abs: 4.2 10*3/uL (ref 1.7–7.7)
Neutrophils Relative %: 50 %

## 2023-06-11 LAB — HCG, SERUM, QUALITATIVE: Preg, Serum: NEGATIVE

## 2023-06-11 LAB — CBC
HCT: 41.9 % (ref 36.0–46.0)
Hemoglobin: 14.2 g/dL (ref 12.0–15.0)
MCH: 28.6 pg (ref 26.0–34.0)
MCHC: 33.9 g/dL (ref 30.0–36.0)
MCV: 84.5 fL (ref 80.0–100.0)
Platelets: 261 10*3/uL (ref 150–400)
RBC: 4.96 MIL/uL (ref 3.87–5.11)
RDW: 13.2 % (ref 11.5–15.5)
WBC: 8.2 10*3/uL (ref 4.0–10.5)
nRBC: 0 % (ref 0.0–0.2)

## 2023-06-11 LAB — TROPONIN I (HIGH SENSITIVITY): Troponin I (High Sensitivity): <2 ng/L (ref <18)

## 2023-06-11 LAB — PROTIME-INR
INR: 1 (ref 0.8–1.2)
Prothrombin Time: 12.9 s (ref 11.4–15.2)

## 2023-06-11 LAB — CBG MONITORING, ED: Glucose-Capillary: 109 mg/dL — ABNORMAL HIGH (ref 70–99)

## 2023-06-11 LAB — APTT: aPTT: 27 s (ref 24–36)

## 2023-06-11 NOTE — Consult Note (Signed)
[facility] Tele[facility]rology Consult Services   Patient Name:   Kimberly Larson, Kimberly Larson Date of Birth:   December 22, 1990 Identification Number:   MRN - [id number] Date of Service:   06/11/2023 23:14:13  Diagnosis:       R20.2 - Paresthesia of skin  Impression:      Differential includes stroke/TIA versus migraine aura equivalent although she is never had the symptoms before recommend MRI brain without contrast to definitively rule out recent stroke. Headache cocktails as needed. Cardiac workup per the ER. Neuro follow-up recommendations relayed to ER physician at the bedside.  Sign Out:       Discussed with Emergency Department Provider    ------------------------------------------------------------------------------  Advanced Imaging: Advanced Imaging Deferred because:  Non-disabling symptoms as verified by the patient; no cortical signs so not consistent with LVO   Metrics: Last Known Well: Unknown Dispatch Time: 06/11/2023 23:14:13 Arrival Time: 06/11/2023 22:50:00 Initial Response Time: 06/11/2023 23:22:29 Symptoms: dizzy, HA, paresthesias. Initial patient interaction: 06/11/2023 23:25:49 NIHSS Assessment Completed: 06/11/2023 23:33:37 Patient is not a candidate for Thrombolytic. Thrombolytic Medical Decision: 06/11/2023 23:33:38 Patient was not deemed candidate for Thrombolytic because of following reasons: LKW outside 4.5 hr window. .  I personally Reviewed the CT Head and it Showed no acute abnormalities  Primary Provider Notified of Diagnostic Impression and Management Plan on: 06/11/2023 23:34:09    ------------------------------------------------------------------------------  History of Present Illness: Patient is a 33 year old Female.  Patient was brought by private transportation with symptoms of dizzy, HA, paresthesias. 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 Reports that she  was in the ER on 06/06/2023 for chest pain and bradycardia. She has been worked up by cardiology. However today she was feeling dizzy. She also started having a headache. It sounds if she has been having the symptoms on and off in the past few days although this evening she began also having some paresthesias involving the left side of her chest and arm as well as radiating to the left jaw and elevated left leg. Symptoms seem to have the most part resolved at this time. Her exam is nonfocal, NIH of scale is 0. She denies ever having the symptoms in the past with a headache or migraine.   Past Medical History: Other PMH:  See HPI Above  Medications:  No Anticoagulant use  No Antiplatelet use Reviewed EMR for current medications  Allergies:  Reviewed  Social History: Drug Use: No  Family History:  There is no family history of premature cerebrovascular disease pertinent to this consultation  ROS : 14 Points Review of Systems was performed and was negative except mentioned in HPI.  Past Surgical History: There Is No Surgical History Contributory To Today's Visit    Examination: BP(166/81), Pulse(55), 1A: Level of Consciousness - Alert; keenly responsive + 0 1B: Ask Month and Age - Both Questions Right + 0 1C: Blink Eyes & Squeeze Hands - Performs Both Tasks + 0 2: Test Horizontal Extraocular Movements - Normal + 0 3: Test Visual Fields - No Visual Loss + 0 4: Test Facial Palsy (Use Grimace if Obtunded) - Normal symmetry + 0 5A: Test Left Arm Motor Drift - No Drift for 10 Seconds + 0 5B: Test Right Arm Motor Drift - No Drift for 10 Seconds + 0 6A: Test Left Leg Motor Drift - No Drift for 5 Seconds + 0 6B: Test Right Leg Motor Drift - No Drift for 5 Seconds + 0 7: Test Limb Ataxia (FNF/Heel-Shin) -  No Ataxia + 0 8: Test Sensation - Normal; No sensory loss + 0 9: Test Language/Aphasia - Normal; No aphasia + 0 10: Test Dysarthria - Normal + 0 11: Test Extinction/Inattention - No  abnormality + 0  NIHSS Score: 0   Pre-Morbid Modified Rankin Scale: 0 Points = No symptoms at all  Spoke with : .  This consult was conducted in real time using interactive audio and Immunologist. Patient was informed of the technology being used for this visit and agreed to proceed. Patient located in hospital and provider located at home/office setting.   Patient is being evaluated for possible acute neurologic impairment and high probability of imminent or life-threatening deterioration. I spent total of 35 minutes providing care to this patient, including time for face to face visit via telemedicine, review of medical records, imaging studies and discussion of findings with providers, the patient and/or family.   Dr Lacie Scotts   [facility] For Inpatient follow-up with [facility] physician please call [facility] at [phone]. As we are not an outpatient service for any post hospital discharge needs please contact the hospital for assistance. If you have any questions for the [facility] physicians or need to reconsult for clinical or diagnostic changes please contact us via [facility] at [phone].

## 2023-06-11 NOTE — ED Provider Notes (Signed)
[facility] DE[facility] AT [facility] Provider Note   CSN: [id number] Arrival date & time: 06/11/23  2250  An emergency department physician performed an initial assessment on this suspected stroke patient at 2305.  History  Chief Complaint  Patient presents with   Weakness   Level 5 caveat due to acuity of condition Kimberly Larson is a 33 y.o. female.  The history is provided by the patient.  Patient reports around 9 PM tonight she started having weakness and numbness in the left side of her body.  She had been having some recent chest pain and tightness but the weakness and numbness is new. Reports  she saw her cardiologist recently for her chest symptoms.  She reports a history of bradycardia No neck or back pain   Past Medical History:  Diagnosis Date   ADD (attention deficit disorder)    Angio-edema    Chronic sinus infection    GERD (gastroesophageal reflux disease)    Iron deficiency    Irritable bowel syndrome    Personal history of urinary (tract) infection    Pregnancy induced hypertension    Raynaud's disease    Seasonal allergies    Urinary frequency 05/15/2011    Home Medications Prior to Admission medications   Medication Sig Start Date End Date Taking? Authorizing Provider  ADDERALL 10 MG tablet 1 tablet Orally Twice a day for 30 days 06/05/23   [provider]  ALPRAZolam Prudy Feeler) 0.5 MG tablet     [provider]  valACYclovir (VALTREX) 500 MG tablet Take by mouth as needed.    [provider]      Allergies    Ciprofloxacin hcl and Nitrofurantoin    Review of Systems   Review of Systems  Unable to perform ROS: Acuity of condition  Constitutional:  Positive for chills. Negative for fever.  Eyes:  Negative for visual disturbance.  Cardiovascular:  Positive for chest pain.  Neurological:  Positive for dizziness, weakness, numbness and headaches. Negative for syncope and speech difficulty.    Physical  Exam Updated Vital Signs BP 118/71   Pulse (!) 52   Temp 97.7 F (36.5 C)   Resp 18   Ht 1.702 m (5\' 7" )   Wt 83.5 kg   SpO2 98%   BMI 28.82 kg/m  Physical Exam CONSTITUTIONAL: Well developed/well nourished HEAD: Normocephalic/atraumatic EYES: EOMI/PERRL, no nystagmus, no ptosis ENMT: Mucous membranes moist NECK: supple no meningeal signs CV -S1/S2, no loud murmurs LUNGS:  no apparent distress NEURO:Awake/alert, face symmetric, no arm drift is noted Left lower extremity drift is noted EXTREMITIES: pulses normalx4, full ROM SKIN: warm, color normal PSYCH: no abnormalities of mood noted   ED Results / Procedures / Treatments   Labs (all labs ordered are listed, but only abnormal results are displayed) Labs Reviewed  COMPREHENSIVE METABOLIC PANEL - Abnormal; Notable for the following components:      Result Value   Potassium 3.3 (*)    Glucose, Bld 101 (*)    BUN 21 (*)    Creatinine, Ser 1.04 (*)    AST 14 (*)    All other components within normal limits  CBG MONITORING, ED - Abnormal; Notable for the following components:   Glucose-Capillary 109 (*)    All other components within normal limits  PREGNANCY, URINE  PROTIME-INR  APTT  CBC  DIFFERENTIAL  RAPID URINE DRUG SCREEN, HOSP PERFORMED  HCG, SERUM, QUALITATIVE  TROPONIN I (HIGH SENSITIVITY)    EKG EKG reveals  sinus rhythm with sinus arrhythmia and incomplete right bundle branch block at 63 bpm, no significant change from prior  Radiology DG Chest [facility] 1 View Result Date: 06/12/2023 CLINICAL DATA:  Left-sided weakness EXAM: PORTABLE CHEST 1 VIEW COMPARISON:  None Available. FINDINGS: The heart size and mediastinal contours are within normal limits. Both lungs are clear. The visualized skeletal structures are unremarkable. IMPRESSION: No active disease. Electronically Signed   By: Alcide Clever M.D.   On: 06/12/2023 00:10   CT HEAD CODE STROKE WO CONTRAST Result Date: 06/11/2023 CLINICAL DATA:  Code stroke.   Neuro deficit, acute, stroke suspected EXAM: CT HEAD WITHOUT CONTRAST TECHNIQUE: Contiguous axial images were obtained from the base of the skull through the vertex without intravenous contrast. RADIATION DOSE REDUCTION: This exam was performed according to the departmental dose-optimization program which includes automated exposure control, adjustment of the mA and/or kV according to patient size and/or use of iterative reconstruction technique. COMPARISON:  CT head September 08, 2019 FINDINGS: Brain: No evidence of acute infarction, hemorrhage, hydrocephalus, extra-axial collection or mass lesion/mass effect. Vascular: No hyperdense vessel. Skull: No acute fracture. Sinuses/Orbits: No acute finding. Other: No mastoid effusions. ASPECTS [facility] Stroke Program Early CT Score) Total score (0-10 with 10 being normal): 10. IMPRESSION: 1. No evidence of acute large vascular territory infarct or acute hemorrhage. 2. ASPECTS is 10. Code stroke imaging results were communicated on 06/11/2023 at 11:25 pm to provider [facility] via secure text paging. Electronically Signed   By: Feliberto Harts M.D.   On: 06/11/2023 23:27    Procedures Procedures    Medications Ordered in ED Medications - No data to display  ED Course/ Medical Decision Making/ A&P Clinical Course as of 06/12/23 0100  Thu Jun 11, 2023  2306 I was called to evaluate the patient due to weakness and numbness.  Patient reports last known well around 9 PM.  Patient reports numbness and pain in her left face, left arm and weakness in her left leg.  No obvious facial droop.  No left arm drift.  There is weakness noted left leg.  Code stroke called [DW]  2324 Patient back from CT, awaiting neuro evaluation [DW]  2326 Call from radiology, no acute findings on CT head [DW]  2334 Seen by teleneuro Not a candidate for TNK Recommends MRI [DW]  2346 Patient feeling improved.  No new weakness.  Reports most of her symptoms are pain in her left chest and left  shoulder,no pleuritic pain, no ripping or tearing sensation in her back.  Pulses are equal in all extremities.  EKGs essentially unchanged from prior She does report recent shortness of breath with exertion, will ambulate here in the ER [DW]  Fri Jun 12, 2023  0057 Overall workup here is reassuring.  Patient ambulated without tachycardia and without hypoxia.  She is in no acute distress.  She is low risk for CAD, with a low heart score, low suspicion for ACS at this time.  She has equal distal pulses in all extremities, no ripping or tearing sensation into her back, low suspicion for aortic dissection.  Low risk for PE at this time as she is not on estrogen products and no recent travel or surgery without tachycardia or hypoxia, she appears to be PERC negative [DW]  0058 In terms of her weakness and paresthesias, patient continues to decline MRI imaging of her brain. [DW]  1610 I discussed at length the recommendations from neurology and myself for MRI brain to rule out stroke.  Patient reports that she is feeling improved she will see her PCP later today at 60 and request neurology referral.  I advised that she is at risk for stroke or disability patient still declines transfer for MRI I advised that I cannot completely rule out a stroke without MRI, and patient continues to decline  I discussed at length strict ER return precautions and when to call 911, this was discussed with patient and her husband [DW]    Clinical Course User Index [DW] Zadie Rhine, MD             HEART Score: 1                    Medical Decision Making Amount and/or Complexity of Data Reviewed Labs: ordered. Radiology: ordered.   This patient presents to the ED for concern of weakness, this involves an extensive number of treatment options, and is a complaint that carries with it a high risk of complications and morbidity.  The differential diagnosis includes but is not limited to CVA, intracranial hemorrhage,  acute coronary syndrome, renal failure, urinary tract infection, electrolyte disturbance, pneumonia Aortic dissection  Comorbidities that complicate the patient evaluation: Patient's presentation is complicated by their history of bradycardia  Social Determinants of Health: Patient's  recent pregnancy   increases the complexity of managing their presentation  Additional history obtained: Records reviewed  cardiology notes reviewed  Lab Tests: I Ordered, and personally interpreted labs.  The pertinent results include: Labs reassuring  Imaging Studies ordered: I ordered imaging studies including CT scan head   I independently visualized and interpreted imaging which showed no acute findings I agree with the radiologist interpretation  Cardiac Monitoring: The patient was maintained on a cardiac monitor.  I personally viewed and interpreted the cardiac monitor which showed an underlying rhythm of:  sinus rhythm  Test Considered: Patient is low risk / negative by heart score, therefore do not feel that cardiac admission is indicated. Appears to be PERC negative   Consultations Obtained: I requested consultation with the consultant neurology , and discussed  findings as well as pertinent plan - they recommend: Recommend MRI  Reevaluation: After the interventions noted above, I reevaluated the patient and found that they have :improved  Complexity of problems addressed: Patient's presentation is most consistent with  acute presentation with potential threat to life or bodily function  Disposition: After consideration of the diagnostic results and the patient's response to treatment,  I feel that the patent would benefit from discharge   .           Final Clinical Impression(s) / ED Diagnoses Final diagnoses:  Weakness  Paresthesia  Precordial pain    Rx / DC Orders ED Discharge Orders          Ordered    Ambulatory referral to Neurology       Comments: An  appointment is requested in approximately: 1 week   06/12/23 0055              Zadie Rhine, MD 06/12/23 [phone]

## 2023-06-11 NOTE — Progress Notes (Signed)
2305 - Code Stroke Activated   mRS 0, Patient was washing bottles when she had a sudden onset of CP with L sided arm & leg weakness.numbness. Patient has also had dizziness all day.  2313 - Patient to CT   2314 - Tele-Neurologist paged   2318 - Patient returned from CT   2323 - Dr. Cheral Bay joined stroke cart   2330 - Dr. Cheral Bay reviewed CT head results while on camera

## 2023-06-11 NOTE — ED Notes (Signed)
Dr Arnell Asal, Neuro on tele

## 2023-06-11 NOTE — ED Triage Notes (Addendum)
Pt was washing child's bottles tonight at approx 2100 and began getting left sided weakness in arm and leg.  Some has subsided but still there.  Pt is seeing a Cardiologist for for CP and bradycardia.  PA Mellody Dance called in  triage - no code stroke.   Dr Bebe Shaggy called Code Stroke @ [phone]

## 2023-06-11 NOTE — ED Notes (Addendum)
-  Activated Code Stroke via Tele-Cart at 1106pm.

## 2023-06-11 NOTE — ED Notes (Signed)
Pt ambulated with no SOB but some dizziness O2 remained 98% HR jumped slightly  from 64 to 75

## 2023-06-12 LAB — RAPID URINE DRUG SCREEN, HOSP PERFORMED
Amphetamines: NOT DETECTED
Barbiturates: NOT DETECTED
Benzodiazepines: NOT DETECTED
Cocaine: NOT DETECTED
Opiates: NOT DETECTED
Tetrahydrocannabinol: NOT DETECTED

## 2023-06-12 LAB — PREGNANCY, URINE: Preg Test, Ur: NEGATIVE

## 2023-06-12 NOTE — Discharge Instructions (Signed)

## 2023-06-15 ENCOUNTER — Ambulatory Visit
Admission: RE | Admit: 2023-06-15 | Discharge: 2023-06-15 | Disposition: A | Discharge status: Home / Self Care | Payer: 59 | Source: Ambulatory Visit | Attending: Physician Assistant | Admitting: Physician Assistant

## 2023-06-15 DIAGNOSIS — R072 Precordial pain: Secondary | ICD-10-CM | POA: Insufficient documentation

## 2023-06-15 LAB — NM MYOCAR MULTI W/SPECT W/WALL MOTION / EF
Angina Index: 1
Duke Treadmill Score: 6
Estimated workload: 12.3
Exercise duration (min): 10 min
Exercise duration (sec): 22 s
LV dias vol: 83 mL (ref 46–106)
LV sys vol: 26 mL
MPHR: 189 {beats}/min
Nuc Stress EF: 69 %
Peak HR: 171 {beats}/min
Percent HR: 90 %
Rest HR: 50 {beats}/min
Rest Nuclear Isotope Dose: 10.3 mCi
SDS: 0
SRS: 9
SSS: 5
ST Depression (mm): 0 mm
Stress Nuclear Isotope Dose: 30.8 mCi
TID: 0.79

## 2023-06-15 MED ORDER — TECHNETIUM TC 99M TETROFOSMIN IV KIT
30.7800 | PACK | Freq: Once | INTRAVENOUS | Status: AC | PRN
  Administered 2023-06-15: 30.78 via INTRAVENOUS

## 2023-06-15 MED ORDER — TECHNETIUM TC 99M TETROFOSMIN IV KIT
10.0000 | PACK | Freq: Once | INTRAVENOUS | Status: AC | PRN
  Administered 2023-06-15: 10.27 via INTRAVENOUS

## 2023-06-16 ENCOUNTER — Ambulatory Visit: Payer: 59 | Admitting: Neurology

## 2023-06-16 ENCOUNTER — Encounter: Payer: Self-pay | Admitting: Neurology

## 2023-06-16 VITALS — BP 125/82 | HR 53 | Ht 67.0 in | Wt 184.0 lb

## 2023-06-16 DIAGNOSIS — R202 Paresthesia of skin: Secondary | ICD-10-CM | POA: Diagnosis not present

## 2023-06-16 DIAGNOSIS — G43E09 Chronic migraine with aura, not intractable, without status migrainosus: Secondary | ICD-10-CM | POA: Diagnosis not present

## 2023-06-16 MED ORDER — RIZATRIPTAN BENZOATE 5 MG PO TBDP: 5.0000 mg | ORAL_TABLET | ORAL | 6 refills | Status: AC | PRN

## 2023-06-16 NOTE — Progress Notes (Signed)
 Chief Complaint  Patient presents with   Extremity Weakness    RM 14 alone Pt is well, reports she currently has random weakness and numbness in the left side of her body. She will also having dizziness and headaches.       ASSESSMENT AND PLAN  Kimberly Larson is a 33 y.o. female   Transient left-sided hemiparesthesia with associated headache  Most consistent with complicated migraine,  She desired further evaluation, proceed with MRI of the brain  Consult as needed  DIAGNOSTIC DATA (LABS, IMAGING, TESTING) - I reviewed patient records, labs, notes, testing and imaging myself where available.   MEDICAL HISTORY:  Kimberly Larson, is a 33 year old female seen in request by her primary care doctor Nichole Senior for evaluation of headache, left-sided paresthesia, initial evaluation June 16, 2023  History is obtained from the patient and review of electronic medical records. I personally reviewed pertinent available imaging films in PACS.   PMHx of  ADD 10mg  daily since age 48 Anxiety  She developed preeclampsia during her recent pregnancy, had induction of vaginal delivery on August 26 2022, at [redacted] weeks pregnant, postdelivery, she recovered well  She had long history of bradycardia, usually triggered by exertion, presenting with chest tightness, was seen by cardiology in the past, no clear etiology found, no need for treatment  ED presentation June 06, 2023, when she was playing with her 34-month-old daughter, she had chest pain, her smart watch began to alarm her about the bradycardia with heart rate of 38,  Troponin was negative, symptoms improved with Pepcid   ED presentation again on June 11, 2023, around 9 PM, while washing baby bottle, she had a sudden onset chest tightness, radiating numbness to left arm, later began to involving her left leg, left jaw, but denies significant weakness, followed by severe holoacranial pressure headache, lasting 3 to 4 hours, eventually  relieved by Tylenol , and sleep  CT head without contrast was normal  She denies a previous history of headache, often has abdominal cramp, occasionally mild headache with her menses,      PHYSICAL EXAM:   Vitals:   06/16/23 0918  BP: 125/82  Pulse: (!) 53  Weight: 184 lb (83.5 kg)  Height: 5' 7 (1.702 m)     Body mass index is 28.82 kg/m.  PHYSICAL EXAMNIATION:  Gen: NAD, conversant, well nourised, well groomed                     Cardiovascular: Regular rate rhythm, no peripheral edema, warm, nontender. Eyes: Conjunctivae clear without exudates or hemorrhage Neck: Supple, no carotid bruits. Pulmonary: Clear to auscultation bilaterally   NEUROLOGICAL EXAM:  MENTAL STATUS: Speech/cognition: Awake, alert, oriented to history taking and casual conversation CRANIAL NERVES: CN II: Visual fields are full to confrontation. Pupils are round equal and briskly reactive to light.  Funduscopy examinations were normal CN III, IV, VI: extraocular movement are normal. No ptosis. CN V: Facial sensation is intact to light touch CN VII: Face is symmetric with normal eye closure  CN VIII: Hearing is normal to causal conversation. CN IX, X: Phonation is normal. CN XI: Head turning and shoulder shrug are intact  MOTOR: There is no pronator drift of out-stretched arms. Muscle bulk and tone are normal. Muscle strength is normal.  REFLEXES: Reflexes are 2+ and symmetric at the biceps, triceps, knees, and ankles. Plantar responses are flexor.  SENSORY: Intact to light touch, pinprick and vibratory sensation are intact in fingers and  toes.  COORDINATION: There is no trunk or limb dysmetria noted.  GAIT/STANCE: Posture is normal. Gait is steady with normal steps, base, arm swing, and turning. Heel and toe walking are normal. Tandem gait is normal.  Romberg is absent.  REVIEW OF SYSTEMS:  Full 14 system review of systems performed and notable only for as above All other review of  systems were negative.   ALLERGIES: Allergies  Allergen Reactions   Ciprofloxacin Hcl Nausea Only   Nitrofurantoin Other (See Comments)    Other reaction(s): Vomiting Other reaction(s): Vomiting    HOME MEDICATIONS: Current Outpatient Medications  Medication Sig Dispense Refill   ADDERALL 10 MG tablet 1 tablet Orally Twice a day for 30 days     ALPRAZolam (XANAX) 0.5 MG tablet      valACYclovir (VALTREX) 500 MG tablet Take by mouth as needed.     No current facility-administered medications for this visit.    PAST MEDICAL HISTORY: Past Medical History:  Diagnosis Date   ADD (attention deficit disorder)    Angio-edema    Chronic sinus infection    GERD (gastroesophageal reflux disease)    Iron deficiency    Irritable bowel syndrome    Personal history of urinary (tract) infection    Pregnancy induced hypertension    Raynaud's disease    Seasonal allergies    Urinary frequency 05/15/2011    PAST SURGICAL HISTORY: Past Surgical History:  Procedure Laterality Date   COLONOSCOPY     CYSTOSCOPY     DRUG INDUCED ENDOSCOPY     WISDOM TOOTH EXTRACTION      FAMILY HISTORY: Family History  Problem Relation Age of Onset   Hypertension Mother    Graves' disease Mother    Crohn's disease Mother    Irritable bowel syndrome Mother    Other Father        trigeminal neuralgia   Asthma Sister    Heart disease Maternal Grandmother    Irritable bowel syndrome Maternal Grandmother    Heart disease Maternal Grandfather    Irritable bowel syndrome Maternal Grandfather     SOCIAL HISTORY: Social History   Socioeconomic History   Marital status: Married    Spouse name: Not on file   Number of children: Not on file   Years of education: Not on file   Highest education level: Not on file  Occupational History   Not on file  Tobacco Use   Smoking status: Never   Smokeless tobacco: Never  Vaping Use   Vaping status: Never Used  Substance and Sexual Activity   Alcohol  use: Not Currently    Alcohol/week: 4.0 standard drinks of alcohol    Types: 4 Cans of beer per week    Comment: some weeks   Drug use: No   Sexual activity: Not Currently  Other Topics Concern   Not on file  Social History Narrative   Not on file   Social Drivers of Health   Financial Resource Strain: Not on file  Food Insecurity: No Food Insecurity (08/28/2022)   Hunger Vital Sign    Worried About Running Out of Food in the Last Year: Never true    Ran Out of Food in the Last Year: Never true  Transportation Needs: No Transportation Needs (08/28/2022)   PRAPARE - Administrator, Civil Service (Medical): No    Lack of Transportation (Non-Medical): No  Physical Activity: Not on file  Stress: Not on file  Social Connections: Not on file  Intimate Partner Violence: Not At Risk (08/28/2022)   Humiliation, Afraid, Rape, and Kick questionnaire    Fear of Current or Ex-Partner: No    Emotionally Abused: No    Physically Abused: No    Sexually Abused: No      Modena Callander, M.D. Ph.D.  [facility] [address] [place], [place] [address] Ph: [phone] Fax: [phone]  CC:  Midge Golas, MD [address] [place],  [place] [address]  Nichole Senior, MD

## 2023-06-17 ENCOUNTER — Ambulatory Visit: Payer: 59 | Attending: Physician Assistant

## 2023-06-17 DIAGNOSIS — R072 Precordial pain: Secondary | ICD-10-CM

## 2023-06-17 DIAGNOSIS — R079 Chest pain, unspecified: Secondary | ICD-10-CM

## 2023-06-18 LAB — ECHOCARDIOGRAM COMPLETE
Area-P 1/2: 4.06 cm2
S' Lateral: 3.2 cm

## 2023-07-14 ENCOUNTER — Encounter: Payer: Self-pay | Admitting: Emergency Medicine

## 2023-07-17 ENCOUNTER — Ambulatory Visit: Payer: 59 | Admitting: Primary Care

## 2023-08-19 ENCOUNTER — Encounter: Payer: Self-pay | Admitting: Primary Care

## 2023-08-19 ENCOUNTER — Ambulatory Visit: Admitting: Primary Care

## 2023-08-19 VITALS — BP 131/84 | HR 59 | Temp 97.9°F | Ht 67.0 in | Wt 184.0 lb

## 2023-08-19 DIAGNOSIS — R918 Other nonspecific abnormal finding of lung field: Secondary | ICD-10-CM

## 2023-08-19 DIAGNOSIS — R001 Bradycardia, unspecified: Secondary | ICD-10-CM | POA: Diagnosis not present

## 2023-08-19 DIAGNOSIS — R0789 Other chest pain: Secondary | ICD-10-CM | POA: Diagnosis not present

## 2023-08-19 DIAGNOSIS — G4734 Idiopathic sleep related nonobstructive alveolar hypoventilation: Secondary | ICD-10-CM

## 2023-08-19 DIAGNOSIS — R0683 Snoring: Secondary | ICD-10-CM

## 2023-08-19 NOTE — Patient Instructions (Signed)
 -  SLEEP APNEA: Sleep apnea is a condition where breathing repeatedly stops and starts during sleep. Given your weight change and ongoing symptoms, we will conduct a three-night home sleep study to reassess for sleep apnea.  -BRADYCARDIA: Bradycardia is a slower than normal heart rate. We will evaluate for potential sleep apnea as a cause of your nocturnal bradycardia by conducting a home sleep study.  -CHEST PAIN: Chest pain can have various causes, including heart and lung issues. We will perform a CT scan of your chest to rule out lung-related causes and you will follow up with cardiology.  -LUNG NODULE: A lung nodule is a small growth in the lung that is usually benign. We will order a CT scan of your chest to follow up on the lung nodule noted in 2021, especially given your chest pain.  INSTRUCTIONS:  Please complete the three-night home sleep study as ordered. Additionally, schedule and complete the CT scan of your chest. Follow up with your cardiologist as planned to further evaluate your chest pain and bradycardia.  Follow-up Call for testing 2-3 weeks after completing both HST and CT chest

## 2023-08-19 NOTE — Progress Notes (Signed)
 @Patient  ID: Kimberly Larson, female    DOB: 01/06/1991, 33 y.o.   MRN: [id number]  Chief Complaint  Patient presents with   Follow-up    Discuss if another HST is needed. Not started on any treatment for sleep apnea.    Referring provider: Adrian Prince, MD  HPI: 33 year old female, never smoked. PMH significant for HTN, chronic migraine headaches, nocturnal hypoxia, ADD, bradycardia, seasonal allergies, raynaud's phenomenon.    08/19/2023 Discussed the use of AI scribe software for clinical note transcription with the patient, who gave verbal consent to proceed.  History of Present Illness   Kimberly Larson is a 34 year old female with bradycardia and potential sleep apnea who presents with episodes of low heart rate and chest pain.  In 2023, she initially sought medical attention for potential sleep apnea, during which bradycardia was discovered. Recently, at the end of January or February, she experienced a concerning episode that led to a full stroke workup in the ER, which ruled out a stroke. She was advised to follow up with cardiology and pulmonary specialists. She experiences episodes of low heart rate, with readings below 40, the lowest being 36-37, during daytime activities such as playing with her daughter. These episodes are accompanied by chest pain. Since then, she has only received low heart rate notifications at night, with the most recent on April 1st, showing a heart rate of 37-38. She does not experience palpitations but describes a tightness in her chest, which sometimes prevents her from lying on her side due to pain.  Her sleep study in June 2023 was negative for sleep disorder breathing, with occasional apneic events and an average heart rate of 54 during sleep. She experiences symptoms of dry throat upon waking, snoring as noted by her husband, and occasional morning headaches.  In 2021, a 4mm lung nodule was incidentally noted on a CT scan. A recent chest x-ray in January  showed clear lungs, but a follow-up CT scan has not been performed. She denies any history of smoking but does acknowledge some secondhand smoke exposure.  She is one year postpartum, having had preeclampsia during her pregnancy, and her weight increased to 230 pounds at delivery. She is working towards returning to her pre-pregnancy weight of around 166 pounds.      Allergies  Allergen Reactions   Ciprofloxacin Hcl Nausea Only   Nitrofurantoin Other (See Comments)    Other reaction(s): Vomiting Other reaction(s): Vomiting    Immunization History  Administered Date(s) Administered   PFIZER(Purple Top)SARS-COV-2 Vaccination 12/20/2019, 01/10/2020    Past Medical History:  Diagnosis Date   ADD (attention deficit disorder)    Angio-edema    Chronic sinus infection    GERD (gastroesophageal reflux disease)    Iron deficiency    Irritable bowel syndrome    Personal history of urinary (tract) infection    Pregnancy induced hypertension    Raynaud's disease    Seasonal allergies    Urinary frequency 05/15/2011    Tobacco History: Social History   Tobacco Use  Smoking Status Never  Smokeless Tobacco Never   Counseling given: Not Answered   Outpatient Medications Prior to Visit  Medication Sig Dispense Refill   ADDERALL 10 MG tablet 1 tablet Orally Twice a day for 30 days     ALPRAZolam (XANAX) 0.5 MG tablet      rizatriptan (MAXALT-MLT) 5 MG disintegrating tablet Take 1 tablet (5 mg total) by mouth as needed. May repeat in 2 hours  if needed 10 tablet 6   valACYclovir (VALTREX) 500 MG tablet Take by mouth as needed.     No facility-administered medications prior to visit.   Review of System  Review of Systems  Constitutional:  Positive for fatigue.  HENT: Negative.    Respiratory: Negative.    Cardiovascular: Negative.    Physical Exam  BP 131/84 (BP Location: Right Arm, Patient Position: Sitting, Cuff Size: Large)   Pulse (!) 59   Temp 97.9 F (36.6 C) (Oral)    Ht 5\' 7"  (1.702 m)   Wt 184 lb (83.5 kg)   SpO2 97%   BMI 28.82 kg/m  Physical Exam Constitutional:      General: She is not in acute distress.    Appearance: Normal appearance. She is not ill-appearing.  HENT:     Head: Normocephalic and atraumatic.     Mouth/Throat:     Mouth: Mucous membranes are moist.     Pharynx: Oropharynx is clear.  Cardiovascular:     Rate and Rhythm: Normal rate and regular rhythm.  Pulmonary:     Effort: Pulmonary effort is normal.     Breath sounds: Normal breath sounds. No wheezing or rhonchi.  Musculoskeletal:        General: Normal range of motion.  Skin:    General: Skin is warm and dry.  Neurological:     General: No focal deficit present.     Mental Status: She is alert and oriented to person, place, and time. Mental status is at baseline.  Psychiatric:        Mood and Affect: Mood normal.        Behavior: Behavior normal.        Thought Content: Thought content normal.        Judgment: Judgment normal.      Lab Results:  CBC    Component Value Date/Time   WBC 8.2 06/11/2023 2308   RBC 4.96 06/11/2023 2308   HGB 14.2 06/11/2023 2308   HCT 41.9 06/11/2023 2308   PLT 261 06/11/2023 2308   MCV 84.5 06/11/2023 2308   MCH 28.6 06/11/2023 2308   MCHC 33.9 06/11/2023 2308   RDW 13.2 06/11/2023 2308   LYMPHSABS 3.3 06/11/2023 2308   MONOABS 0.5 06/11/2023 2308   EOSABS 0.1 06/11/2023 2308   BASOSABS 0.0 06/11/2023 2308    BMET    Component Value Date/Time   NA 140 06/11/2023 2308   K 3.3 (L) 06/11/2023 2308   CL 103 06/11/2023 2308   CO2 27 06/11/2023 2308   GLUCOSE 101 (H) 06/11/2023 2308   BUN 21 (H) 06/11/2023 2308   CREATININE 1.04 (H) 06/11/2023 2308   CALCIUM 9.3 06/11/2023 2308   GFRNONAA >60 06/11/2023 2308   GFRAA >60 09/08/2019 2049    BNP    Component Value Date/Time   BNP 39.5 06/06/2023 1829    ProBNP No results found for: "PROBNP"  Imaging: No results found.   Assessment & Plan:    1.  Abnormal findings on diagnostic imaging of lung (Primary) - CT Chest Wo Contrast; Future  2. Atypical chest pain - CT Chest Wo Contrast; Future  3. Nocturnal hypoxia - Home sleep test; Future  4. Bradycardia - Home sleep test; Future  5. Loud snoring - Home sleep test; Future   Assessment and Plan    Sleep Apnea Weight gain postpartum and symptoms suggest increased risk of sleep apnea. Previous sleep study negative, but reassessment needed due to weight change and  symptoms. - Order a three-night home sleep study to reassess for sleep apnea given the weight change and ongoing symptoms.  Bradycardia Nocturnal bradycardia with arrhythmia noted. Potential link to sleep apnea considered.  - Order home sleep study to evaluate for potential sleep apnea as a cause of nocturnal bradycardia.  Chest Pain Chest pain described as tightness when lying on side. CT scan to rule out lung-related causes. Follow-up with cardiology scheduled. - Document chest pain as a reason for the CT scan of the chest.  Lung Nodule 4 mm lung nodule noted in 2021, likely benign. Follow-up CT scan needed to assess for changes, especially with chest pain present. - Order CT scan of the chest to follow up on the lung nodule noted in June 2021.      Glenford Bayley, NP 08/19/2023

## 2023-08-20 ENCOUNTER — Encounter: Payer: Self-pay | Admitting: Internal Medicine

## 2023-08-20 ENCOUNTER — Ambulatory Visit: Payer: 59 | Attending: Internal Medicine | Admitting: Internal Medicine

## 2023-08-20 VITALS — BP 110/70 | HR 61 | Ht 67.0 in | Wt 183.5 lb

## 2023-08-20 DIAGNOSIS — R002 Palpitations: Secondary | ICD-10-CM

## 2023-08-20 DIAGNOSIS — R001 Bradycardia, unspecified: Secondary | ICD-10-CM

## 2023-08-20 DIAGNOSIS — R079 Chest pain, unspecified: Secondary | ICD-10-CM

## 2023-08-20 DIAGNOSIS — R519 Headache, unspecified: Secondary | ICD-10-CM

## 2023-08-20 NOTE — Patient Instructions (Signed)
 Medication Instructions:   Your Physician recommend you continue on your current medication as directed.     *If you need a refill on your cardiac medications before your next appointment, please call your pharmacy*  Lab Work:  No labs ordered today    Testing/Procedures:  No test ordered today   Follow-Up: At [facility], you and your health needs are our priority.  As part of our continuing mission to provide you with exceptional heart care, our providers are all part of one team.  This team includes your primary Cardiologist (physician) and Advanced Practice Providers or APPs (Physician Assistants and Nurse Practitioners) who all work together to provide you with the care you need, when you need it.  Your next appointment:   1 year(s)  Provider:   You may see Yvonne Kendall, MD or one of the following Advanced Practice Providers on your designated Care Team:   Nicolasa Ducking, NP Ames Dura, PA-C Eula Listen, PA-C Cadence Escanaba, PA-C Charlsie Quest, NP Carlos Levering, NP    We recommend signing up for the patient portal called "MyChart".  Sign up information is provided on this After Visit Summary.  MyChart is used to connect with patients for Virtual Visits (Telemedicine).  Patients are able to view lab/test results, encounter notes, upcoming appointments, etc.  Non-urgent messages can be sent to your provider as well.   To learn more about what you can do with MyChart, go to [email or web address].

## 2023-08-20 NOTE — Progress Notes (Unsigned)
 Cardiology Office Note:  .   Date:  08/21/2023  ID:  Kimberly Larson, DOB [birth year], MRN [id number] PCP: Adrian Prince, MD  [facility] Providers Cardiologist:  Yvonne Kendall, MD     History of Present Illness: .   Kimberly Larson is a 33 y.o. female with history of hypertension, ADD, pulmonary nodules, recurrent URIs and strep infections, irritable bowel syndrome, and seasonal allergies, who presents for follow-up of chest pain.  She was last seen in our office in 05/2023 by Eula Listen, PA, for follow-up after ED visit for chest pain.  ED workup was unrevealing other than possible right upper lung nodule.  At the time of her visit, she complained of a several day history of intermittent headaches, dizziness, diaphoresis, bradycardia, and chest discomfort.  Subsequent MPI and echo were both normal.  Event monitor showed predominantly sinus rhythm with rare PACs and PVCs.  Today, Kimberly Larson reports that she feels somewhat better than in January though she continues to have some sporadic chest pain that she describes as heaviness and tightness.  It seems to be positional, especially when lying down in certain ways.  She has been exercising more and has not noticed any exertional chest pain.  The chest pain seems to coincide with her menstrual cycle.  Her lightheadedness and dizziness have also improved.  She still notes sporadic paresthesias and numbness every few days, particularly in the left leg, and has been referred for a brain MRI.  She has not had any significant dyspnea.  Palpitations have been rare.  She notes improvement off Adderall.  Kimberly Larson reports some low oxygen and heart rate readings on her smart watch at night.  Her pulmonologist has referred her for a sleep study for further evaluation.  She would like to become pregnant again but was told by her obstetrician that she should wait at least 18 months from her last delivery (she delivered about a year ago).  ROS: See HPI  Studies  Reviewed: Marland Kitchen   EKG Interpretation Date/Time:  Thursday August 20 2023 09:00:30 EDT Ventricular Rate:  61 PR Interval:  148 QRS Duration:  104 QT Interval:  446 QTC Calculation: 448 R Axis:   34  Text Interpretation: Normal sinus rhythm with sinus arrhythmia Incomplete right bundle branch block T wave abnormality, consider inferior ischemia Abnormal ECG When compared with ECG of 11-Jun-2023 23:09, Inverted T waves have replaced nonspecific T wave abnormality in Inferior leads T wave inversion no longer evident in Anterior leads Confirmed by Pleasant Britz, Cristal Deer [phone]) on 08/21/2023 6:53:37 PM    Event monitor (06/09/2023): Predominantly sinus rhythm with rare PACs and PVCs.  No significant arrhythmia observed.  TTE (06/17/2023): Normal LV size and wall thickness.  LVEF 60-65% with normal wall motion and diastolic parameters.  GLS -18.1%.  Normal RV size and function.  Normal biatrial size.  No pericardial effusion.  No valvular abnormalities.  Normal CVP.  Exercise MPI (06/15/2023): Normal, low risk exercise MPI without evidence of ischemia or scar.  Good exercise capacity.  Normal LVEF.  No coronary artery calcification.  Risk Assessment/Calculations:             Physical Exam:   VS:  BP 110/70 (BP Location: Left Arm, Patient Position: Sitting, Cuff Size: Normal)   Pulse 61   Ht 5\' 7"  (1.702 m)   Wt 183 lb 8 oz (83.2 kg)   SpO2 98%   BMI 28.74 kg/m    Wt Readings from Last 3 Encounters:  08/20/23 183 lb 8 oz (83.2 kg)  08/19/23 184 lb (83.5 kg)  06/16/23 184 lb (83.5 kg)    General:  NAD. Neck: No JVD or HJR. Lungs: Clear to auscultation bilaterally without wheezes or crackles. Heart: Regular rate and rhythm without murmurs, rubs, or gallops. Abdomen: Soft, nontender, nondistended. Extremities: No lower extremity edema.  ASSESSMENT AND PLAN: .    Chest pain: Kimberly Larson continues to have sporadic chest pain though it seems less frequent in nature and is now somewhat related to her  menstrual cycle.  Her EKG today again shows subtle inferior T wave changes as well as an incomplete right bundle branch block.  Workup thus far has been reassuring with normal exercise MPI and echocardiogram.  I do not think that further ischemia testing is needed at this time.  Palpitations and bradycardia: Recent event monitor showed predominantly sinus rhythm with heart rates as low as 39 bpm, which is not unexpected in an otherwise healthy young woman.  She had rare supraventricular and ventricular ectopy that may account for some of her palpitations.  I do not recommend further cardiac testing or intervention at this time.  I encouraged Kimberly Larson to continue avoidance of Adderall and other stimulants, if possible.  It is reasonable to pursue sleep study to exclude undiagnosed sleep apnea in the setting of nocturnal hypoxia and bradycardia detected by her smart watch.    Dispo: Return to clinic in 1 year.  Signed, Yvonne Kendall, MD

## 2023-08-21 ENCOUNTER — Encounter: Payer: Self-pay | Admitting: Internal Medicine

## 2023-08-21 DIAGNOSIS — R002 Palpitations: Secondary | ICD-10-CM | POA: Insufficient documentation

## 2023-09-02 ENCOUNTER — Encounter

## 2023-09-02 DIAGNOSIS — R0683 Snoring: Secondary | ICD-10-CM

## 2023-09-02 DIAGNOSIS — G4734 Idiopathic sleep related nonobstructive alveolar hypoventilation: Secondary | ICD-10-CM

## 2023-09-02 DIAGNOSIS — R001 Bradycardia, unspecified: Secondary | ICD-10-CM

## 2023-09-04 ENCOUNTER — Ambulatory Visit
Admission: RE | Admit: 2023-09-04 | Discharge: 2023-09-04 | Disposition: A | Discharge status: Home / Self Care | Source: Ambulatory Visit | Attending: Primary Care | Admitting: Primary Care

## 2023-09-04 DIAGNOSIS — R0789 Other chest pain: Secondary | ICD-10-CM

## 2023-09-04 DIAGNOSIS — R918 Other nonspecific abnormal finding of lung field: Secondary | ICD-10-CM

## 2023-09-22 DIAGNOSIS — R0683 Snoring: Secondary | ICD-10-CM | POA: Diagnosis not present

## 2023-09-23 ENCOUNTER — Ambulatory Visit: Payer: Self-pay | Admitting: Primary Care

## 2023-09-23 NOTE — Progress Notes (Signed)
 Sleep study was negative for OSA, AHI was 2.1 events per hour. She had a couple of obstructive events but they were not frequent enough for diagnosis of sleep apnea. If snoring remains an issue can be referred to orthodontics for oral appliance. If she wants to go over testing in more detail set up a follow-up

## 2023-10-14 ENCOUNTER — Ambulatory Visit (INDEPENDENT_AMBULATORY_CARE_PROVIDER_SITE_OTHER)

## 2023-10-14 ENCOUNTER — Ambulatory Visit: Admitting: Podiatry

## 2023-10-14 VITALS — Ht 67.0 in | Wt 183.0 lb

## 2023-10-14 DIAGNOSIS — S92405A Nondisplaced unspecified fracture of left great toe, initial encounter for closed fracture: Secondary | ICD-10-CM

## 2023-10-14 NOTE — Patient Instructions (Signed)

## 2023-10-15 NOTE — Progress Notes (Signed)
 Subjective:   Patient ID: Kimberly Larson, female   DOB: 33 y.o.   MRN: [id number]   HPI Patient presents stating she severely traumatized her left big toenail and it has been dark and it has been painful.  This occurred several nights ago with her daughter dropping this on her foot and she has had a history of losing this nail at 1 time in the past.  Patient is very active does not smoke   Review of Systems  All other systems reviewed and are negative.       Objective:  Physical Exam Vitals and nursing note reviewed.  Constitutional:      Appearance: She is well-developed.  Pulmonary:     Effort: Pulmonary effort is normal.  Musculoskeletal:        General: Normal range of motion.  Skin:    General: Skin is warm.  Neurological:     Mental Status: She is alert.     Neurovascular status intact muscle strength was found to be adequate range of motion adequate inflammation of the left big toe with a history of trauma and discoloration of the nailbed with pain black like underlying area indicating probability for blood formation low-grade infection possibility of tearing of the underlying nailbed     Assessment:  Severe trauma to the left hallux from injury with severe nail disease and paronychia like infection     Plan:  H&P reviewed x-ray taken reviewed to rule out bone structural issue and today I infiltrated the left big toe 60 mg like Marcaine  mixture sterile prep applied using sterile instrumentation removed the left hallux nail flushed out the bed did find a small tear in the bed but it should heal on its own with sterile dressing applied and I did flush out the proximal portion where there may have been some slight low-grade localized infection but no indication of proximal infection.  Gave strict instructions that if any changes were to occur any redness drainage I want a start her on an antibiotic but we will try to hold off and that this will take several weeks to heal no  guarantee as to how the nail will regrow in the future  X-rays indicate no signs of bony pathology appears to be strictly soft tissue left

## 2023-12-14 ENCOUNTER — Telehealth: Payer: Self-pay | Admitting: Neurology

## 2023-12-14 NOTE — Telephone Encounter (Signed)
 Patient cancelled appointment due to not having any migraines at this time.

## 2023-12-17 ENCOUNTER — Ambulatory Visit: Payer: 59 | Admitting: Adult Health

## 2024-05-17 ENCOUNTER — Telehealth: Payer: Self-pay | Admitting: Neurology

## 2024-05-17 NOTE — Telephone Encounter (Signed)
 Patient LVM at 11:55 am: Calling to follow up on MRI. Suppose to get brain MRI done and never scheduled that. Now needing to schedule that. Have new insurance as well so need schedule and update insurance.

## 2024-05-17 NOTE — Telephone Encounter (Signed)
 I added her insurance to her chart.

## 2024-05-17 NOTE — Telephone Encounter (Signed)
 Pt has accepted Dr Georgianne next available with wait list. Her Insurance info is:  [employer] Subscriber PI:[id number]  [phone] Provider Line# [phone]

## 2024-06-08 ENCOUNTER — Other Ambulatory Visit (HOSPITAL_BASED_OUTPATIENT_CLINIC_OR_DEPARTMENT_OTHER): Payer: Self-pay

## 2024-06-08 ENCOUNTER — Other Ambulatory Visit: Payer: Self-pay

## 2024-06-08 ENCOUNTER — Encounter (HOSPITAL_BASED_OUTPATIENT_CLINIC_OR_DEPARTMENT_OTHER): Payer: Self-pay | Admitting: Emergency Medicine

## 2024-06-08 ENCOUNTER — Emergency Department (HOSPITAL_BASED_OUTPATIENT_CLINIC_OR_DEPARTMENT_OTHER)

## 2024-06-08 ENCOUNTER — Emergency Department (HOSPITAL_BASED_OUTPATIENT_CLINIC_OR_DEPARTMENT_OTHER)
Admission: EM | Admit: 2024-06-08 | Discharge: 2024-06-08 | Disposition: A | Discharge status: Home / Self Care | Attending: Emergency Medicine | Admitting: Emergency Medicine

## 2024-06-08 DIAGNOSIS — R112 Nausea with vomiting, unspecified: Secondary | ICD-10-CM | POA: Insufficient documentation

## 2024-06-08 DIAGNOSIS — R0789 Other chest pain: Secondary | ICD-10-CM | POA: Insufficient documentation

## 2024-06-08 DIAGNOSIS — R197 Diarrhea, unspecified: Secondary | ICD-10-CM | POA: Insufficient documentation

## 2024-06-08 DIAGNOSIS — R824 Acetonuria: Secondary | ICD-10-CM | POA: Diagnosis not present

## 2024-06-08 DIAGNOSIS — R0981 Nasal congestion: Secondary | ICD-10-CM | POA: Insufficient documentation

## 2024-06-08 DIAGNOSIS — E876 Hypokalemia: Secondary | ICD-10-CM | POA: Diagnosis not present

## 2024-06-08 DIAGNOSIS — R059 Cough, unspecified: Secondary | ICD-10-CM | POA: Insufficient documentation

## 2024-06-08 LAB — RESP PANEL BY RT-PCR (RSV, FLU A&B, COVID)  RVPGX2
Influenza A by PCR: NEGATIVE
Influenza B by PCR: NEGATIVE
Resp Syncytial Virus by PCR: NEGATIVE
SARS Coronavirus 2 by RT PCR: NEGATIVE

## 2024-06-08 LAB — URINALYSIS, ROUTINE W REFLEX MICROSCOPIC
Bacteria, UA: NONE SEEN
Bilirubin Urine: NEGATIVE
Glucose, UA: NEGATIVE mg/dL
Hgb urine dipstick: NEGATIVE
Ketones, ur: 15 mg/dL — AB
Nitrite: NEGATIVE
Protein, ur: 30 mg/dL — AB
Specific Gravity, Urine: 1.034 — ABNORMAL HIGH (ref 1.005–1.030)
pH: 6.5 (ref 5.0–8.0)

## 2024-06-08 LAB — CBC WITH DIFFERENTIAL/PLATELET
Abs Immature Granulocytes: 0.02 10*3/uL (ref 0.00–0.07)
Basophils Absolute: 0 10*3/uL (ref 0.0–0.1)
Basophils Relative: 0 %
Eosinophils Absolute: 0 10*3/uL (ref 0.0–0.5)
Eosinophils Relative: 0 %
HCT: 40.5 % (ref 36.0–46.0)
Hemoglobin: 14 g/dL (ref 12.0–15.0)
Immature Granulocytes: 0 %
Lymphocytes Relative: 24 %
Lymphs Abs: 1.1 10*3/uL (ref 0.7–4.0)
MCH: 28.9 pg (ref 26.0–34.0)
MCHC: 34.6 g/dL (ref 30.0–36.0)
MCV: 83.5 fL (ref 80.0–100.0)
Monocytes Absolute: 0.4 10*3/uL (ref 0.1–1.0)
Monocytes Relative: 9 %
Neutro Abs: 3.2 10*3/uL (ref 1.7–7.7)
Neutrophils Relative %: 67 %
Platelets: 169 10*3/uL (ref 150–400)
RBC: 4.85 MIL/uL (ref 3.87–5.11)
RDW: 12.9 % (ref 11.5–15.5)
WBC: 4.8 10*3/uL (ref 4.0–10.5)
nRBC: 0 % (ref 0.0–0.2)

## 2024-06-08 LAB — C DIFFICILE QUICK SCREEN W PCR REFLEX
C Diff antigen: NEGATIVE
C Diff interpretation: NOT DETECTED
C Diff toxin: NEGATIVE

## 2024-06-08 LAB — PREGNANCY, URINE: Preg Test, Ur: NEGATIVE

## 2024-06-08 LAB — TROPONIN T, HIGH SENSITIVITY
Troponin T High Sensitivity: <6 ng/L (ref 0–19)
Troponin T High Sensitivity: <6 ng/L (ref 0–19)

## 2024-06-08 LAB — COMPREHENSIVE METABOLIC PANEL WITH GFR
ALT: 6 U/L (ref 0–44)
AST: 19 U/L (ref 15–41)
Albumin: 4.2 g/dL (ref 3.5–5.0)
Alkaline Phosphatase: 53 U/L (ref 38–126)
Anion gap: 13 (ref 5–15)
BUN: 12 mg/dL (ref 6–20)
CO2: 24 mmol/L (ref 22–32)
Calcium: 9 mg/dL (ref 8.9–10.3)
Chloride: 100 mmol/L (ref 98–111)
Creatinine, Ser: 0.83 mg/dL (ref 0.44–1.00)
GFR, Estimated: >60 mL/min
Glucose, Bld: 92 mg/dL (ref 70–99)
Potassium: 3.3 mmol/L — ABNORMAL LOW (ref 3.5–5.1)
Sodium: 137 mmol/L (ref 135–145)
Total Bilirubin: 0.9 mg/dL (ref 0.0–1.2)
Total Protein: 6.9 g/dL (ref 6.5–8.1)

## 2024-06-08 LAB — LIPASE, BLOOD: Lipase: 13 U/L (ref 11–51)

## 2024-06-08 LAB — CBG MONITORING, ED: Glucose-Capillary: 89 mg/dL (ref 70–99)

## 2024-06-08 MED ORDER — GI COCKTAIL ~~LOC~~
30.0000 mL | Freq: Two times a day (BID) | ORAL | 0 refills | Status: AC
  Filled 2024-06-08: qty 200, 3d supply, fill #0

## 2024-06-08 MED ORDER — ALUM & MAG HYDROXIDE-SIMETH 200-200-20 MG/5ML PO SUSP
30.0000 mL | Freq: Once | ORAL | Status: AC
  Administered 2024-06-08: 30 mL via ORAL
  Filled 2024-06-08: qty 30

## 2024-06-08 MED ORDER — DICYCLOMINE HCL 20 MG PO TABS
20.0000 mg | ORAL_TABLET | Freq: Two times a day (BID) | ORAL | 0 refills | Status: AC
  Filled 2024-06-08: qty 20, 10d supply, fill #0

## 2024-06-08 MED ORDER — LOPERAMIDE HCL 2 MG PO CAPS
4.0000 mg | ORAL_CAPSULE | Freq: Once | ORAL | Status: AC
  Administered 2024-06-08: 4 mg via ORAL
  Filled 2024-06-08: qty 2

## 2024-06-08 MED ORDER — ACETAMINOPHEN 325 MG PO TABS
650.0000 mg | ORAL_TABLET | Freq: Once | ORAL | Status: AC
  Administered 2024-06-08: 650 mg via ORAL
  Filled 2024-06-08: qty 2

## 2024-06-08 MED ORDER — SODIUM CHLORIDE 0.9 % IV BOLUS
1000.0000 mL | Freq: Once | INTRAVENOUS | Status: AC
  Administered 2024-06-08: 1000 mL via INTRAVENOUS

## 2024-06-08 MED ORDER — ONDANSETRON HCL 4 MG/2ML IJ SOLN
4.0000 mg | Freq: Once | INTRAMUSCULAR | Status: DC
  Filled 2024-06-08: qty 2

## 2024-06-08 MED ORDER — ONDANSETRON 4 MG PO TBDP
4.0000 mg | ORAL_TABLET | Freq: Three times a day (TID) | ORAL | 0 refills | Status: AC | PRN
  Filled 2024-06-08: qty 20, 7d supply, fill #0

## 2024-06-08 NOTE — ED Notes (Signed)
 Orthostatics to be performed after fluids... Confirmed with PA.

## 2024-06-08 NOTE — Discharge Instructions (Addendum)
 It was a pleasure taking care of you today.  As discussed, your workup is reassuring.  I suspect you have a viral infection causing your symptoms.  Cardiac labs were normal.  I am sending you home with symptomatic treatment.  Take as needed.  Please continue drinking extra fluids over the next few days.  Follow-up with PCP if symptoms do not improve.  Return to the ER for any worsening symptoms.

## 2024-06-08 NOTE — ED Provider Notes (Signed)
 " [place] EMERGENCY DEPARTMENT AT [facility] Provider Note   CSN: [id number] Arrival date & time: 06/08/24  1116     Patient presents with: Emesis   Kimberly Larson is a 34 y.o. female with a past medical history significant for ADHD, IBS, iron deficiency anemia, and GERD who presents to the ED due to nausea, vomiting, and diarrhea x 2 days.  Daughter sick with similar symptoms a few days prior.  Patient states this morning everything went fuzzy and she fell to the ground.  Did not lose consciousness.  No head injury.  Admits to generalized weakness.  Also endorses some left-sided intermittent chest pain.  Denies associated shortness of breath.  No cardiac history.  Denies history of blood clots,, recent surgeries, recent long mobilizations, and hormonal treatments.  No lower extremity edema.  Admits to numerous episodes of nonbloody, nonbilious emesis and nonbloody diarrhea.  Also endorses nasal congestion and cough.  History obtained from patient and past medical records. No interpreter used during encounter.       Prior to Admission medications  Medication Sig Start Date End Date Taking? Authorizing Provider  Alum & Mag Hydroxide-Simeth (GI COCKTAIL) SUSP suspension Take 30 mLs by mouth 2 (two) times daily. Shake well. 06/08/24  Yes Rayaan Lorah, Aleck BROCKS, PA-C  dicyclomine  (BENTYL ) 20 MG tablet Take 1 tablet (20 mg total) by mouth 2 (two) times daily. 06/08/24  Yes Aditi Rovira, Aleck BROCKS, PA-C  ondansetron  (ZOFRAN ) 8 MG tablet Take 8 mg by mouth 2 (two) times daily as needed. 06/03/24  Yes [provider]  ondansetron  (ZOFRAN -ODT) 4 MG disintegrating tablet Take 1 tablet (4 mg total) by mouth every 8 (eight) hours as needed for nausea or vomiting. 06/08/24  Yes Teneil Shiller C, PA-C  ADDERALL 10 MG tablet 1 tablet Orally Twice a day for 30 days 06/05/23   [provider]  ALPRAZolam SHEFFIELD) 0.5 MG tablet     [provider]  rizatriptan  (MAXALT -MLT) 5 MG  disintegrating tablet Take 1 tablet (5 mg total) by mouth as needed. May repeat in 2 hours if needed 06/16/23   Onita Duos, MD  valACYclovir (VALTREX) 500 MG tablet Take by mouth as needed.    [provider]    Allergies: Ciprofloxacin hcl and Nitrofurantoin    Review of Systems  Constitutional:  Positive for fatigue and fever.  HENT:  Positive for congestion.   Respiratory:  Positive for cough. Negative for shortness of breath.   Cardiovascular:  Positive for chest pain.  Gastrointestinal:  Positive for abdominal pain, diarrhea, nausea and vomiting.    Updated Vital Signs BP 122/76   Pulse 67   Temp 98 F (36.7 C)   Resp 17   SpO2 100%   Physical Exam Vitals and nursing note reviewed.  Constitutional:      General: She is not in acute distress.    Appearance: She is not ill-appearing.  HENT:     Head: Normocephalic.  Eyes:     Pupils: Pupils are equal, round, and reactive to light.  Cardiovascular:     Rate and Rhythm: Normal rate and regular rhythm.     Pulses: Normal pulses.     Heart sounds: Normal heart sounds. No murmur heard.    No friction rub. No gallop.  Pulmonary:     Effort: Pulmonary effort is normal.     Breath sounds: Normal breath sounds.  Abdominal:     General: Abdomen is flat. There is no distension.  Palpations: Abdomen is soft.     Tenderness: There is no abdominal tenderness. There is no guarding or rebound.  Musculoskeletal:        General: Normal range of motion.     Cervical back: Neck supple.  Skin:    General: Skin is warm and dry.  Neurological:     General: No focal deficit present.     Mental Status: She is alert.  Psychiatric:        Mood and Affect: Mood normal.        Behavior: Behavior normal.     (all labs ordered are listed, but only abnormal results are displayed) Labs Reviewed  COMPREHENSIVE METABOLIC PANEL WITH GFR - Abnormal; Notable for the following components:      Result Value   Potassium 3.3 (*)     All other components within normal limits  URINALYSIS, ROUTINE W REFLEX MICROSCOPIC - Abnormal; Notable for the following components:   Specific Gravity, Urine 1.034 (*)    Ketones, ur 15 (*)    Protein, ur 30 (*)    Leukocytes,Ua TRACE (*)    All other components within normal limits  RESP PANEL BY RT-PCR (RSV, FLU A&B, COVID)  [id number]  GASTROINTESTINAL PANEL BY PCR, STOOL (REPLACES STOOL CULTURE)  C DIFFICILE QUICK SCREEN W PCR REFLEX    LIPASE, BLOOD  CBC WITH DIFFERENTIAL/PLATELET  PREGNANCY, URINE  CBG MONITORING, ED  TROPONIN T, HIGH SENSITIVITY  TROPONIN T, HIGH SENSITIVITY    EKG: EKG Interpretation Date/Time:  Wednesday June 08 2024 11:26:28 EST Ventricular Rate:  78 PR Interval:  143 QRS Duration:  110 QT Interval:  393 QTC Calculation: 448 R Axis:   128  Text Interpretation: Sinus rhythm Consider RVH w/ secondary repol abnormality Abnormal T, consider ischemia, lateral leads No significant change since last tracing Confirmed by Dean Clarity [id number]) on 06/08/2024 11:54:57 AM  Radiology: [facility] Chest Portable 1 View Result Date: 06/08/2024 EXAM: 1 VIEW XRAY OF THE CHEST 06/08/2024 11:35:00 AM COMPARISON: None available. CLINICAL HISTORY: Chest pain. FINDINGS: LUNGS AND PLEURA: No focal pulmonary opacity. No pleural effusion. No pneumothorax. HEART AND MEDIASTINUM: No acute abnormality of the cardiac and mediastinal silhouettes. BONES AND SOFT TISSUES: No acute osseous abnormality. IMPRESSION: 1. No acute cardiopulmonary abnormality. Electronically signed by: Ryan Salvage MD 06/08/2024 12:14 PM EST RP Workstation: [id number]     Procedures   Medications Ordered in the ED  ondansetron  (ZOFRAN ) injection 4 mg (4 mg Intravenous Patient Refused/Not Given 06/08/24 1200)  sodium chloride  0.9 % bolus 1,000 mL (0 mLs Intravenous Stopped 06/08/24 1303)  alum & mag hydroxide-simeth (MAALOX/MYLANTA) 200-200-20 MG/5ML suspension 30 mL (30 mLs Oral Given 06/08/24 1246)  sodium  chloride 0.9 % bolus 1,000 mL (0 mLs Intravenous Stopped 06/08/24 1454)  loperamide  (IMODIUM ) capsule 4 mg (4 mg Oral Given 06/08/24 1432)  acetaminophen  (TYLENOL ) tablet 650 mg (650 mg Oral Given 06/08/24 1503)    Clinical Course as of 06/08/24 1513  Wed Jun 08, 2024  1228 Patient with worsening chest pain since arrival.  Initial troponin normal.  Will repeat EKG. Possibly GI related. GI cocktail given.  [CA]  1247 Ketones, ur(!): 15 [CA]  1247 Protein(!): 30 [CA]  1247 Potassium(!): 3.3 [CA]  1348 Reassessed patient at bedside.  Patient having persistent diarrhea and is going to the bathroom every few minutes.  No recent antibiotics.  No recent travel.  Still suspect this is viral in nature however, will obtain stool samples.  Imodium  given. [CA]  Clinical Course User Index [CA] Lorelle Aleck BROCKS, PA-C                                 Medical Decision Making Amount and/or Complexity of Data Reviewed Labs: ordered. Decision-making details documented in ED Course. Radiology: ordered and independent interpretation performed. Decision-making details documented in ED Course. ECG/medicine tests: ordered and independent interpretation performed. Decision-making details documented in ED Course.  Risk OTC drugs. Prescription drug management.   This patient presents to the ED for concern of NVD, this involves an extensive number of treatment options, and is a complaint that carries with it a high risk of complications and morbidity.  The differential diagnosis includes viral process, dehydration, electrolyte abnormalities, etc  34 year old female presents to the ED due to nausea, vomiting, and diarrhea x 2 days.  Had a near syncopal episode today.  Daughter sick with similar symptoms.  Also admits to some chest pain.  Upon arrival, vitals all within normal limits.  Patient in no acute distress.  Abdomen soft, nondistended, nontender. Low suspicion for acute abdomen. Patient does appear  slightly pale on exam.  Suspect near syncopal episode likely related to dehydration from numerous episodes of emesis and diarrhea.  Given patient is having chest pain will obtain cardiac labs.  Low suspicion for PE.  Routine labs ordered.  IV fluids and Zofran  given.  CBC unremarkable.  No leukocytosis.  Normal hemoglobin.  Lipase normal.  Low suspicion for pancreatitis.  CMP with normal renal function.  Mild hypokalemia 3.3.  UA without evidence of infection.  Does demonstrate ketonuria and proteinuria likely due to dehydration.  RVP negative.  Pregnancy test negative.  Troponin normal. EKG NSR. Low suspicion for ACS.  Informed by RN that patient developing worsening chest pain.  Initial troponin normal.  EKG without evidence of acute ischemia.  Possible GI related.  GI cocktail given.  Repeat EKG reassuring.  Will obtain second troponin to rule out ACS.  2nd troponin normal.  Chest pain resolved after GI cocktail. EKG without evidence of ischemia. Suspect GI etiology.  Low suspicion for ACS.  Low suspicion for PE/DVT.  Presentation not concerning for aortic dissection.  Suspect patient has gastroenteritis.  Abdomen soft, nondistended, nontender.  Low suspicion for acute abdomen.  Upon reassessment, patient able to tolerate p.o. at bedside.  Patient discharged with symptomatic treatment.  Patient stable for discharge. Strict ED precautions discussed with patient. Patient states understanding and agrees to plan. Patient discharged home in no acute distress and stable vitals  Co morbidities that complicate the patient evaluation  IBS Cardiac Monitoring: / EKG:  The patient was maintained on a cardiac monitor.  I personally viewed and interpreted the cardiac monitored which showed an underlying rhythm of: NSR, HR 78  Social Determinants of Health:  Lives independently  Test / Admission - Considered:  Considered admission however, patient able to tolerate p.o.  Symptoms improved after symptomatic  treatment in the ED.  Patient stable for discharge.     Final diagnoses:  Nausea vomiting and diarrhea    ED Discharge Orders          Ordered    ondansetron  (ZOFRAN -ODT) 4 MG disintegrating tablet  Every 8 hours PRN        06/08/24 1510    Alum & Mag Hydroxide-Simeth (GI COCKTAIL) SUSP suspension  2 times daily        06/08/24 1510    dicyclomine  (  BENTYL ) 20 MG tablet  2 times daily        06/08/24 1510               Lorelle Aleck JAYSON DEVONNA 06/08/24 1514    Dean Clarity, MD 06/08/24 1516  "

## 2024-06-08 NOTE — ED Notes (Addendum)
 Unable to provide stool sample at time, aware sample is needed  Elected to hold immodium for now to provide sample

## 2024-06-08 NOTE — ED Triage Notes (Signed)
 Had Norovirus on Monday. Reports weakness since. Near syncopal episode today.

## 2024-06-08 NOTE — ED Notes (Signed)
 Patient in restroom Collection hat in toilet to collect sample

## 2024-06-08 NOTE — ED Notes (Signed)
 DC paperwork given and verbally understood.

## 2024-06-09 ENCOUNTER — Telehealth (HOSPITAL_BASED_OUTPATIENT_CLINIC_OR_DEPARTMENT_OTHER): Payer: Self-pay | Admitting: Emergency Medicine

## 2024-06-09 LAB — GASTROINTESTINAL PANEL BY PCR, STOOL (REPLACES STOOL CULTURE)

## 2024-09-06 ENCOUNTER — Ambulatory Visit: Payer: Self-pay | Admitting: Neurology
